# Patient Record
Sex: Male | Born: 1985 | Race: White | Hispanic: No | Marital: Married | Smoking: Never smoker
Health system: Southern US, Community
[De-identification: ages and names within clinical notes are randomized; demographics above are authoritative.]

## PROBLEM LIST (undated history)

## (undated) DIAGNOSIS — K852 Alcohol induced acute pancreatitis without necrosis or infection: Secondary | ICD-10-CM

## (undated) DIAGNOSIS — K219 Gastro-esophageal reflux disease without esophagitis: Secondary | ICD-10-CM

---

## 2006-09-24 ENCOUNTER — Emergency Department (HOSPITAL_COMMUNITY): Admission: EM | Admit: 2006-09-24 | Discharge: 2006-09-24 | Payer: Self-pay | Admitting: Emergency Medicine

## 2006-10-09 ENCOUNTER — Emergency Department (HOSPITAL_COMMUNITY): Admission: EM | Admit: 2006-10-09 | Discharge: 2006-10-09 | Payer: Self-pay | Admitting: Family Medicine

## 2011-01-03 LAB — WOUND CULTURE
Culture: NO GROWTH
Gram Stain: NONE SEEN

## 2012-07-30 ENCOUNTER — Ambulatory Visit (INDEPENDENT_AMBULATORY_CARE_PROVIDER_SITE_OTHER): Payer: Commercial Managed Care - PPO | Admitting: Family Medicine

## 2012-07-30 VITALS — BP 122/88 | HR 62 | Temp 98.6°F | Resp 16 | Ht 70.0 in | Wt 167.2 lb

## 2012-07-30 DIAGNOSIS — L42 Pityriasis rosea: Secondary | ICD-10-CM

## 2012-07-30 NOTE — Patient Instructions (Addendum)
Pityriasis Rosea Pityriasis rosea is a rash which is probably caused by a virus. It generally starts as a scaly, red patch on the trunk (the area of the body that a t-shirt would cover) but does not appear on sun exposed areas. The rash is usually preceded by an initial larger spot called the "herald patch" a week or more before the rest of the rash appears. Generally within one to two days the rash appears rapidly on the trunk, upper arms, and sometimes the upper legs. The rash usually appears as flat, oval patches of scaly pink color. The rash can also be raised and one is able to feel it with a finger. The rash can also be finely crinkled and may slough off leaving a ring of scale around the spot. Sometimes a mild sore throat is present with the rash. It usually affects children and young adults in the spring and autumn. Women are more frequently affected than men. TREATMENT  Pityriasis rosea is a self-limited condition. This means it goes away within 4 to 8 weeks without treatment. The spots may persist for several months, especially in darker-colored skin after the rash has resolved and healed. Benadryl and steroid creams may be used if itching is a problem. SEEK MEDICAL CARE IF:   Your rash does not go away or persists longer than three months.  You develop fever and joint pain.  You develop severe headache and confusion.  You develop breathing difficulty, vomiting and/or extreme weakness. Document Released: 04/13/2001 Document Revised: 05/30/2011 Document Reviewed: 05/02/2008 Exit[employer] Patient Information 2013 [employer].  

## 2012-07-31 ENCOUNTER — Encounter: Payer: Self-pay | Admitting: Family Medicine

## 2012-07-31 NOTE — Progress Notes (Signed)
28 year old gentleman comes in with personal rash over his entire torso. Is not itchy, he's had no fever or stiff neck, and no new exposures to the medicine or environmental possibilities.  Objective patient has a typical (rosea rash scattered over his entire chest and much of his upper back characterized by elliptical raised have to 1 cm plaques.  Assessment: Pityriasis rosea  Plan: Patient given information about pityriasis rosea and reassured.

## 2014-05-06 ENCOUNTER — Ambulatory Visit (INDEPENDENT_AMBULATORY_CARE_PROVIDER_SITE_OTHER): Payer: Managed Care, Other (non HMO) | Admitting: Family Medicine

## 2014-05-06 ENCOUNTER — Encounter: Payer: Self-pay | Admitting: Family Medicine

## 2014-05-06 VITALS — BP 145/90 | HR 73 | Temp 98.4°F | Resp 16 | Ht 71.0 in | Wt 179.0 lb

## 2014-05-06 DIAGNOSIS — R1032 Left lower quadrant pain: Secondary | ICD-10-CM

## 2014-05-06 LAB — COMPREHENSIVE METABOLIC PANEL
ALBUMIN: 4.7 g/dL (ref 3.5–5.2)
ALK PHOS: 45 U/L (ref 39–117)
ALT: 46 U/L (ref 0–53)
AST: 32 U/L (ref 0–37)
BILIRUBIN TOTAL: 0.9 mg/dL (ref 0.2–1.2)
BUN: 10 mg/dL (ref 6–23)
CALCIUM: 9.9 mg/dL (ref 8.4–10.5)
CO2: 25 mEq/L (ref 19–32)
CREATININE: 0.99 mg/dL (ref 0.50–1.35)
Chloride: 102 mEq/L (ref 96–112)
Glucose, Bld: 97 mg/dL (ref 70–99)
Potassium: 4.9 mEq/L (ref 3.5–5.3)
Sodium: 137 mEq/L (ref 135–145)
Total Protein: 7.3 g/dL (ref 6.0–8.3)

## 2014-05-06 LAB — POCT UA - MICROSCOPIC ONLY
CASTS, UR, LPF, POC: NEGATIVE
CRYSTALS, UR, HPF, POC: NEGATIVE
EPITHELIAL CELLS, URINE PER MICROSCOPY: NEGATIVE
Mucus, UA: NEGATIVE
YEAST UA: NEGATIVE

## 2014-05-06 LAB — CBC
HEMATOCRIT: 46.7 % (ref 39.0–52.0)
HEMOGLOBIN: 16.2 g/dL (ref 13.0–17.0)
MCH: 30.5 pg (ref 26.0–34.0)
MCHC: 34.7 g/dL (ref 30.0–36.0)
MCV: 87.9 fL (ref 78.0–100.0)
MPV: 9.1 fL (ref 8.6–12.4)
Platelets: 279 10*3/uL (ref 150–400)
RBC: 5.31 MIL/uL (ref 4.22–5.81)
RDW: 13.3 % (ref 11.5–15.5)
WBC: 8.4 10*3/uL (ref 4.0–10.5)

## 2014-05-06 LAB — POCT URINALYSIS DIPSTICK
BILIRUBIN UA: NEGATIVE
Glucose, UA: NEGATIVE
KETONES UA: NEGATIVE
Leukocytes, UA: NEGATIVE
Nitrite, UA: NEGATIVE
PH UA: 6.5
PROTEIN UA: NEGATIVE
RBC UA: NEGATIVE
Spec Grav, UA: 1.015
Urobilinogen, UA: 0.2

## 2014-05-06 NOTE — Patient Instructions (Signed)
Take ibuprofen 600 mg every 8 hours for next 24 hours Return if you have fever over 101, severe vomiting, diarrhea.

## 2014-05-06 NOTE — Progress Notes (Signed)
Subjective:    Patient ID: Kerry Fernandez, male    DOB: May 31, 1985, 29 y.o.   MRN: [id number]  HPI This is a very pleasant 29 yo male who presents today with intermittent, left lower quadrant abdominal pain for 4 days. It was really bad when he woke up this morning. Excruciating when he stood up first thing this morning. Had increased discomfort with voiding x1 this morning, now pain is improved. He last worked out about a week ago- ran, did sit ups and push ups. Pain has not interfered with his normal activities. He has not taken any medication.   No history of kidney stone, no strong family history of kidney stones.   No past medical history on file. No past surgical history on file. Family History  Problem Relation Age of Onset  . Cancer Paternal Grandmother    History  Substance Use Topics  . Smoking status: Never Smoker   . Smokeless tobacco: Not on file  . Alcohol Use: Yes    Review of Systems No fever or chills, no hematuria, no frequency, some sensation of needing to void without needing to void, no nausea, no vomiting, no scrotal pain, no constipation, no diarrhea. Regular BM- last this am, no blood, no mucus, no loose or hard stool, normal appetite. No chest pain, no SOB     Objective:   Physical Exam  Constitutional: He is oriented to person, place, and time. He appears well-developed and well-nourished.  HENT:  Head: Normocephalic and atraumatic.  Eyes: Conjunctivae are normal.  Neck: Normal range of motion. Neck supple.  Cardiovascular: Normal rate, regular rhythm and normal heart sounds.   Pulmonary/Chest: Effort normal and breath sounds normal.  Abdominal: Soft. Bowel sounds are normal. There is no hepatosplenomegaly. There is tenderness in the left lower quadrant. There is no rigidity, no rebound, no guarding and no CVA tenderness. No hernia.  Musculoskeletal: Normal range of motion.  Neurological: He is alert and oriented to person, place, and time.  Skin:  Skin is warm and dry.  Psychiatric: He has a normal mood and affect. His behavior is normal. Judgment and thought content normal.  Vitals reviewed.  BP 145/90 mmHg  Pulse 73  Temp(Src) 98.4 F (36.9 C) (Oral)  Resp 16  Ht  (1.803 m)  Wt 179 lb (81.194 kg)  BMI 24.98 kg/m2  SpO2 98%    Results for orders placed or performed in visit on 05/06/14  POCT urinalysis dipstick  Result Value Ref Range   Color, UA yellow    Clarity, UA clear    Glucose, UA neg    Bilirubin, UA neg    Ketones, UA neg    Spec Grav, UA 1.015    Blood, UA neg    pH, UA 6.5    Protein, UA neg    Urobilinogen, UA 0.2    Nitrite, UA neg    Leukocytes, UA Negative   POCT UA - Microscopic Only  Result Value Ref Range   WBC, Ur, HPF, POC 0-1    RBC, urine, microscopic 1-2    Bacteria, U Microscopic trace    Mucus, UA neg    Epithelial cells, urine per micros neg    Crystals, Ur, HPF, POC neg    Casts, Ur, LPF, POC neg    Yeast, UA neg    Assessment & Plan:  1. Abdominal pain, left lower quadrant - POCT urinalysis dipstick - POCT UA - Microscopic Only - CBC - Comprehensive metabolic  panel - Urine unremarkable, pain improved from this morning, will treat with NSAIDs for next 24 hours while awaiting labs. I will call him tomorrow with results and to see if improvement in symptoms - Patient instructed to return if he develops fever, vomiting, diarrhea  Emi Belfasteborah B. Gessner, FNP-BC  [facility]  05/06/2014 10:55 AM

## 2014-12-25 ENCOUNTER — Ambulatory Visit (INDEPENDENT_AMBULATORY_CARE_PROVIDER_SITE_OTHER): Payer: Managed Care, Other (non HMO) | Admitting: Podiatry

## 2014-12-25 ENCOUNTER — Encounter: Payer: Self-pay | Admitting: Podiatry

## 2014-12-25 VITALS — BP 131/89 | HR 72 | Resp 12

## 2014-12-25 DIAGNOSIS — L03032 Cellulitis of left toe: Secondary | ICD-10-CM

## 2014-12-25 DIAGNOSIS — L600 Ingrowing nail: Secondary | ICD-10-CM | POA: Diagnosis not present

## 2014-12-25 NOTE — Progress Notes (Signed)
Subjective:     Patient ID: Kerry Fernandez, male   DOB: [birth year], 29 y.o.   MRN: [id number]  HPIThis patient presents to the office for treatment for infected ingrown toenail left foot.  He says the nail was worse days ago and pus and drainage drained from his toe.  He says the infection is better but his nail continues to grow in the inside border left big toe.   Review of Systems     Objective:   Physical Exam GENERAL APPEARANCE: Alert, conversant. Appropriately groomed. No acute distress.  VASCULAR: Pedal pulses palpable at  [facility] and PT bilateral.  Capillary refill time is immediate to all digits,  Normal temperature gradient.  Digital hair growth is present bilateral  NEUROLOGIC: sensation is normal to 5.07 monofilament at 5/5 sites bilateral.  Light touch is intact bilateral, Muscle strength normal.  MUSCULOSKELETAL: acceptable muscle strength, tone and stability bilateral.  Intrinsic muscluature intact bilateral.  Rectus appearance of foot and digits noted bilateral.   DERMATOLOGIC: skin color, texture, and turgor are within normal limits.  No preulcerative lesions or ulcers  are seen, no interdigital maceration noted.  No open lesions present.  . No drainage noted. Nail  Marked incurvation noted medial border left hallux.      Assessment:    Ingrown toenail left foot  Paronychia left hallux.     Plan:     IE  Nail surgery left hallux.Treatment options and alternatives discussed.  Recommended permanent phenol matrixectomy and patient agreed.  Left hallux  was prepped with alcohol and a toe block of 3cc of 2% lidocaine plain was administered in a digital toe block. .  The toe was then prepped with betadine solution .  The offending nail border was then excised and matrix tissue exposed.  Phenol was then applied to the matrix tissue followed by an alcohol wash.  Antibiotic ointment and a dry sterile dressing was applied.  The patient was dispensed instructions for aftercare.  RTC 1  week

## 2015-01-01 ENCOUNTER — Ambulatory Visit: Payer: Managed Care, Other (non HMO) | Admitting: Podiatry

## 2015-01-02 ENCOUNTER — Ambulatory Visit (INDEPENDENT_AMBULATORY_CARE_PROVIDER_SITE_OTHER): Payer: Managed Care, Other (non HMO) | Admitting: Podiatry

## 2015-01-02 ENCOUNTER — Encounter: Payer: Self-pay | Admitting: Podiatry

## 2015-01-02 VITALS — BP 136/93 | HR 70 | Resp 12

## 2015-01-02 DIAGNOSIS — Z09 Encounter for follow-up examination after completed treatment for conditions other than malignant neoplasm: Secondary | ICD-10-CM

## 2015-01-02 NOTE — Progress Notes (Signed)
Subjective:     Patient ID: Kerry FischerMatthew A Fernandez, male   DOB: [birth year], 29 y.o.   MRN: [id number]  HPI This patient presents to the office following nail surgery .  He says his toe is doing well and he has been soaking and bandaging his toe as directed.    Review of Systems     Objective:   Physical Exam GENERAL APPEARANCE: Alert, conversant. Appropriately groomed. No acute distress.  VASCULAR: [facility]  [facility].  Capillary refill time is immediate to all digits,  Normal temperature gradient.  Digital hair growth is present bilateral  NEUROLOGIC: sensation is normal to 5.07 monofilament at 5/5 sites bilateral.  Light touch is intact bilateral, Muscle strength normal.  MUSCULOSKELETAL: acceptable muscle strength, tone and stability bilateral.  Intrinsic muscluature intact bilateral.  Rectus appearance of foot and digits noted bilateral.   DERMATOLOGIC: skin color, texture, and turgor are within normal limits.  No preulcerative lesions or ulcers  are seen, no interdigital maceration noted.  No open lesions present.  Digital nails are asymptomatic. No drainage noted. NAILS  No signs of redness or swelling minimal at the surgical site.     Assessment:     S/p nail surgery     Plan:     ROV>  Debride necrotic tissue.  Continue home instructions.

## 2015-08-08 ENCOUNTER — Encounter (HOSPITAL_BASED_OUTPATIENT_CLINIC_OR_DEPARTMENT_OTHER): Payer: Self-pay | Admitting: Emergency Medicine

## 2015-08-08 ENCOUNTER — Emergency Department (HOSPITAL_BASED_OUTPATIENT_CLINIC_OR_DEPARTMENT_OTHER): Payer: Managed Care, Other (non HMO)

## 2015-08-08 ENCOUNTER — Emergency Department (HOSPITAL_BASED_OUTPATIENT_CLINIC_OR_DEPARTMENT_OTHER)
Admission: EM | Admit: 2015-08-08 | Discharge: 2015-08-09 | Disposition: A | Discharge status: Home / Self Care | Payer: Managed Care, Other (non HMO) | Source: Home / Self Care | Attending: Emergency Medicine | Admitting: Emergency Medicine

## 2015-08-08 DIAGNOSIS — K852 Alcohol induced acute pancreatitis without necrosis or infection: Secondary | ICD-10-CM

## 2015-08-08 DIAGNOSIS — R1013 Epigastric pain: Secondary | ICD-10-CM | POA: Diagnosis not present

## 2015-08-08 DIAGNOSIS — R112 Nausea with vomiting, unspecified: Secondary | ICD-10-CM

## 2015-08-08 DIAGNOSIS — R1033 Periumbilical pain: Secondary | ICD-10-CM

## 2015-08-08 HISTORY — DX: Alcohol induced acute pancreatitis without necrosis or infection: K85.20

## 2015-08-08 LAB — COMPREHENSIVE METABOLIC PANEL
ALT: 48 U/L (ref 17–63)
AST: 43 U/L — AB (ref 15–41)
Albumin: 4.7 g/dL (ref 3.5–5.0)
Alkaline Phosphatase: 45 U/L (ref 38–126)
Anion gap: 13 (ref 5–15)
BUN: 15 mg/dL (ref 6–20)
CHLORIDE: 102 mmol/L (ref 101–111)
CO2: 22 mmol/L (ref 22–32)
CREATININE: 1.13 mg/dL (ref 0.61–1.24)
Calcium: 9.1 mg/dL (ref 8.9–10.3)
GFR calc Af Amer: >60 mL/min (ref >60)
GFR calc non Af Amer: >60 mL/min (ref >60)
Glucose, Bld: 136 mg/dL — ABNORMAL HIGH (ref 65–99)
POTASSIUM: 3.3 mmol/L — AB (ref 3.5–5.1)
SODIUM: 137 mmol/L (ref 135–145)
Total Bilirubin: 0.6 mg/dL (ref 0.3–1.2)
Total Protein: 7.6 g/dL (ref 6.5–8.1)

## 2015-08-08 LAB — URINALYSIS, ROUTINE W REFLEX MICROSCOPIC
Bilirubin Urine: NEGATIVE
Glucose, UA: NEGATIVE mg/dL
Hgb urine dipstick: NEGATIVE
Ketones, ur: NEGATIVE mg/dL
LEUKOCYTES UA: NEGATIVE
NITRITE: NEGATIVE
PROTEIN: NEGATIVE mg/dL
SPECIFIC GRAVITY, URINE: 1.012 (ref 1.005–1.030)
pH: 6 (ref 5.0–8.0)

## 2015-08-08 LAB — CBC
HEMATOCRIT: 45 % (ref 39.0–52.0)
Hemoglobin: 16.1 g/dL (ref 13.0–17.0)
MCH: 31.5 pg (ref 26.0–34.0)
MCHC: 35.8 g/dL (ref 30.0–36.0)
MCV: 88.1 fL (ref 78.0–100.0)
PLATELETS: 299 10*3/uL (ref 150–400)
RBC: 5.11 MIL/uL (ref 4.22–5.81)
RDW: 12.7 % (ref 11.5–15.5)
WBC: 14.1 10*3/uL — ABNORMAL HIGH (ref 4.0–10.5)

## 2015-08-08 LAB — LIPASE, BLOOD: LIPASE: 183 U/L — AB (ref 11–51)

## 2015-08-08 MED ORDER — MORPHINE SULFATE (PF) 4 MG/ML IV SOLN
4.0000 mg | INTRAVENOUS | Status: DC | PRN
  Administered 2015-08-08: 4 mg via INTRAVENOUS
  Filled 2015-08-08: qty 1

## 2015-08-08 MED ORDER — MORPHINE SULFATE (PF) 4 MG/ML IV SOLN: 4.0000 mg | Freq: Once | INTRAVENOUS | Status: DC

## 2015-08-08 MED ORDER — HYDROMORPHONE HCL 1 MG/ML IJ SOLN
0.5000 mg | INTRAMUSCULAR | Status: DC | PRN
  Administered 2015-08-08 (×2): 0.5 mg via INTRAVENOUS
  Filled 2015-08-08 (×2): qty 1

## 2015-08-08 MED ORDER — ONDANSETRON HCL 4 MG/2ML IJ SOLN
4.0000 mg | Freq: Once | INTRAMUSCULAR | Status: AC | PRN
  Administered 2015-08-08: 4 mg via INTRAVENOUS
  Filled 2015-08-08: qty 2

## 2015-08-08 MED ORDER — HYDROMORPHONE HCL 1 MG/ML IJ SOLN
1.0000 mg | Freq: Once | INTRAMUSCULAR | Status: AC
  Administered 2015-08-08: 1 mg via INTRAVENOUS
  Filled 2015-08-08: qty 1

## 2015-08-08 MED ORDER — IOPAMIDOL (ISOVUE-300) INJECTION 61%
100.0000 mL | Freq: Once | INTRAVENOUS | Status: AC | PRN
  Administered 2015-08-08: 100 mL via INTRAVENOUS

## 2015-08-08 NOTE — ED Notes (Signed)
Patient transported to CT 

## 2015-08-08 NOTE — ED Notes (Signed)
PA at bedside.

## 2015-08-08 NOTE — ED Provider Notes (Signed)
CSN: [id number]     Arrival date & time 08/08/15  2113 History   First MD Initiated Contact with Patient 08/08/15 2139     Chief Complaint  Patient presents with  . Abdominal Pain     (Consider location/radiation/quality/duration/timing/severity/associated sxs/prior Treatment) HPI Comments: Patient is a 30 year old male with history of acid reflux who presents with acute onset abdominal pain. Patient states this pain is in his middle abdomen. He describes his pain as stabbing, crampy, shooting. The pain does not radiate. Patient rates his pain as a 9/10. Patient has had associated sweats and nausea, vomiting. Patient did not try any medications at [facility]. Patient has no history of abdominal disorders or surgeries. Patient has appendix and gallbladder. Patient states he has 3-4 mixed drinks per day. He had eaten and drank alcohol about 30 minutes prior to symptom onset. Patient denies chest pain, shortness of breath, back pain, dysuria. Patient states he normally takes Tums with good alleviation of his pain from acid reflux. He states today's pain feels nothing like his acid reflux.  Patient is a 30 y.o. male presenting with abdominal pain. The history is provided by the patient.  Abdominal Pain Associated symptoms: nausea and vomiting   Associated symptoms: no chest pain, no chills, no dysuria, no fever, no shortness of breath and no sore throat     History reviewed. No pertinent past medical history. History reviewed. No pertinent past surgical history. Family History  Problem Relation Age of Onset  . Cancer Paternal Grandmother    Social History  Substance Use Topics  . Smoking status: Never Smoker   . Smokeless tobacco: Never Used  . Alcohol Use: 0.0 oz/week    0 Standard drinks or equivalent per week    Review of Systems  Constitutional: Negative for fever and chills.  HENT: Negative for facial swelling and sore throat.   Respiratory: Negative for shortness of breath.     Cardiovascular: Negative for chest pain.  Gastrointestinal: Positive for nausea, vomiting and abdominal pain.  Genitourinary: Negative for dysuria.  Musculoskeletal: Negative for back pain.  Skin: Negative for rash and wound.  Neurological: Negative for headaches.  Psychiatric/Behavioral: The patient is not nervous/anxious.       Allergies  Penicillins  Home Medications   Prior to Admission medications   Medication Sig Start Date End Date Taking? Authorizing Provider  ondansetron (ZOFRAN) 4 MG tablet Take 1 tablet (4 mg total) by mouth every 6 (six) hours. 08/08/15   Emi Holes, PA-C  oxyCODONE-acetaminophen (PERCOCET/ROXICET) 5-325 MG tablet Take 1-2 tablets by mouth every 4 (four) hours as needed for severe pain. 08/08/15   Wetona Viramontes M Fadel Clason, PA-C   BP 124/89 mmHg  Pulse 80  Temp(Src) 97.6 F (36.4 C) (Oral)  Resp 20  Ht 5\' 10"  (1.778 m)  Wt 81.647 kg  BMI 25.83 kg/m2  SpO2 100% Physical Exam  Constitutional: He appears well-developed and well-nourished. No distress.  HENT:  Head: Normocephalic and atraumatic.  Mouth/Throat: Oropharynx is clear and moist. No oropharyngeal exudate.  Eyes: Conjunctivae are normal. Pupils are equal, round, and reactive to light. Right eye exhibits no discharge. Left eye exhibits no discharge. No scleral icterus.  Neck: Normal range of motion. Neck supple. No thyromegaly present.  Cardiovascular: Normal rate, regular rhythm, normal heart sounds and intact distal pulses.  Exam reveals no gallop and no friction rub.   No murmur heard. Pulmonary/Chest: Effort normal and breath sounds normal. No stridor. No respiratory distress. He has no wheezes. He  has no rales.  Abdominal: Soft. Bowel sounds are normal. He exhibits no distension. There is tenderness in the periumbilical area. There is positive Murphy's sign. There is no rebound, no guarding, no CVA tenderness and no tenderness at McBurney's point.    Musculoskeletal: He exhibits no edema.   Lymphadenopathy:    He has no cervical adenopathy.  Neurological: He is alert. Coordination normal.  Skin: Skin is warm. No rash noted. He is diaphoretic. No pallor.  Psychiatric: He has a normal mood and affect.  Nursing note and vitals reviewed.   ED Course  Procedures (including critical care time) Labs Review Labs Reviewed  LIPASE, BLOOD - Abnormal; Notable for the following:    Lipase 183 (*)    All other components within normal limits  COMPREHENSIVE METABOLIC PANEL - Abnormal; Notable for the following:    Potassium 3.3 (*)    Glucose, Bld 136 (*)    AST 43 (*)    All other components within normal limits  CBC - Abnormal; Notable for the following:    WBC 14.1 (*)    All other components within normal limits  URINALYSIS, ROUTINE W REFLEX MICROSCOPIC (NOT AT [facility])  URINALYSIS, ROUTINE W REFLEX MICROSCOPIC (NOT AT [facility])    Imaging Review Ct Abdomen Pelvis W Contrast  08/08/2015  CLINICAL DATA:  30 year old presenting with acute onset of severe right lower quadrant abdominal pain associated with nausea. Leukocytosis with white blood count of [address]. EXAM: CT ABDOMEN AND PELVIS WITH CONTRAST TECHNIQUE: Multidetector CT imaging of the abdomen and pelvis was performed using the standard protocol following bolus administration of intravenous contrast. CONTRAST:  ISOVUE-300 IOPAMIDOL 61% IV. Oral contrast was also administered. COMPARISON:  None. FINDINGS: Lower chest:  Heart size normal.  Visualized lung bases clear. Hepatobiliary: Liver normal in size and appearance. Gallbladder mildly distended without calcified gallstones. No pericholecystic edema/inflammation. No biliary ductal dilation. Pancreas: Normal in appearance without evidence of mass, ductal dilation, or inflammation. Spleen: Normal in size and appearance. Adrenals/Urinary Tract: Normal appearing adrenal glands. Kidneys normal in size and appearance without focal parenchymal abnormality. No evidence of urinary tract  calculi or obstruction. Normal-appearing urinary bladder. Stomach/Bowel: Stomach normal in appearance for the degree of distention. Normal-appearing small bowel. Expected stool burden throughout normal appearing, relatively decompressed colon. Normal appendix in the right upper pelvis. Vascular/Lymphatic: No visible aortoiliofemoral atherosclerosis. Widely patent visceral arteries. Normal-appearing portal venous and systemic venous systems. No pathologic lymphadenopathy. Reproductive: Prostate gland and seminal vesicles normal in size and appearance for age. Other: None. Musculoskeletal: Regional skeleton intact without acute or significant osseous abnormality. IMPRESSION: 1. Mildly distended gallbladder without evidence of cholelithiasis or acute cholecystitis. 2. Otherwise normal examination. Electronically Signed   By: Hulan Saas M.D.   On: 08/08/2015 23:35   I have personally reviewed and evaluated these images and lab results as part of my medical decision-making.   EKG Interpretation None      MDM   Patient's nausea controlled with Zofran. Patient's pain moderately controlled with Dilaudid. CBC shows WBC 14.1. CMP shows potassium 3.3, AST 43, glucose 136. Lipase 183. UA negative. CT abdomen pelvis with contrast shows mildly distended gallbladder without evidence of cholelithiasis or acute cholecystitis, otherwise normal examination. Patient discharged with pain medication and Zofran with follow-up to gastroenterology. Patient tolerating fluids prior to discharge. Patient advised to avoid alcohol intake due to possible pancreatitis. Strict return precautions given. Patient understands and is in agreement with plan. Patient vitals stable throughout ED course and is discharged  in satisfactory condition. Patient discussed with Dr. Particia NearingHaviland who is in agreement with plan.  Final diagnoses:  Periumbilical abdominal pain       Emi Holeslexandra M Rodell Marrs, PA-C 08/09/15 0028

## 2015-08-08 NOTE — ED Notes (Signed)
Patient reports he had had 2 hours of right upper quadrant pain. The patient reports N/V

## 2015-08-09 ENCOUNTER — Telehealth (HOSPITAL_COMMUNITY): Payer: Self-pay

## 2015-08-09 ENCOUNTER — Inpatient Hospital Stay (HOSPITAL_BASED_OUTPATIENT_CLINIC_OR_DEPARTMENT_OTHER)
Admission: EM | Admit: 2015-08-09 | Discharge: 2015-08-15 | DRG: 439 | Disposition: A | Discharge status: Home / Self Care | Payer: Managed Care, Other (non HMO) | Attending: Internal Medicine | Admitting: Internal Medicine

## 2015-08-09 ENCOUNTER — Encounter (HOSPITAL_BASED_OUTPATIENT_CLINIC_OR_DEPARTMENT_OTHER): Payer: Self-pay | Admitting: Emergency Medicine

## 2015-08-09 ENCOUNTER — Encounter (HOSPITAL_COMMUNITY): Payer: Self-pay | Admitting: Family Medicine

## 2015-08-09 ENCOUNTER — Emergency Department (HOSPITAL_COMMUNITY)
Admission: EM | Admit: 2015-08-09 | Discharge: 2015-08-09 | Disposition: A | Discharge status: Home / Self Care | Payer: Managed Care, Other (non HMO) | Source: Home / Self Care | Attending: Emergency Medicine | Admitting: Emergency Medicine

## 2015-08-09 DIAGNOSIS — K859 Acute pancreatitis without necrosis or infection, unspecified: Secondary | ICD-10-CM

## 2015-08-09 DIAGNOSIS — K852 Alcohol induced acute pancreatitis without necrosis or infection: Principal | ICD-10-CM

## 2015-08-09 DIAGNOSIS — R1110 Vomiting, unspecified: Secondary | ICD-10-CM

## 2015-08-09 DIAGNOSIS — Z880 Allergy status to penicillin: Secondary | ICD-10-CM | POA: Insufficient documentation

## 2015-08-09 DIAGNOSIS — K861 Other chronic pancreatitis: Secondary | ICD-10-CM | POA: Diagnosis present

## 2015-08-09 DIAGNOSIS — Z79899 Other long term (current) drug therapy: Secondary | ICD-10-CM | POA: Insufficient documentation

## 2015-08-09 DIAGNOSIS — R1013 Epigastric pain: Secondary | ICD-10-CM

## 2015-08-09 DIAGNOSIS — R509 Fever, unspecified: Secondary | ICD-10-CM

## 2015-08-09 DIAGNOSIS — R109 Unspecified abdominal pain: Secondary | ICD-10-CM

## 2015-08-09 DIAGNOSIS — F1020 Alcohol dependence, uncomplicated: Secondary | ICD-10-CM | POA: Diagnosis present

## 2015-08-09 DIAGNOSIS — K567 Ileus, unspecified: Secondary | ICD-10-CM | POA: Diagnosis not present

## 2015-08-09 DIAGNOSIS — K219 Gastro-esophageal reflux disease without esophagitis: Secondary | ICD-10-CM | POA: Diagnosis present

## 2015-08-09 DIAGNOSIS — K5900 Constipation, unspecified: Secondary | ICD-10-CM | POA: Diagnosis present

## 2015-08-09 HISTORY — DX: Alcohol induced acute pancreatitis without necrosis or infection: K85.20

## 2015-08-09 HISTORY — DX: Gastro-esophageal reflux disease without esophagitis: K21.9

## 2015-08-09 LAB — CBC WITH DIFFERENTIAL/PLATELET
BASOS ABS: 0 10*3/uL (ref 0.0–0.1)
Basophils Relative: 0 %
EOS PCT: 0 %
Eosinophils Absolute: 0 10*3/uL (ref 0.0–0.7)
HEMATOCRIT: 43.3 % (ref 39.0–52.0)
Hemoglobin: 15.4 g/dL (ref 13.0–17.0)
LYMPHS ABS: 0.7 10*3/uL (ref 0.7–4.0)
Lymphocytes Relative: 3 %
MCH: 31.5 pg (ref 26.0–34.0)
MCHC: 35.6 g/dL (ref 30.0–36.0)
MCV: 88.5 fL (ref 78.0–100.0)
Monocytes Absolute: 1.3 10*3/uL — ABNORMAL HIGH (ref 0.1–1.0)
Monocytes Relative: 6 %
NEUTROS ABS: 18.7 10*3/uL — AB (ref 1.7–7.7)
Neutrophils Relative %: 91 %
Platelets: 260 10*3/uL (ref 150–400)
RBC: 4.89 MIL/uL (ref 4.22–5.81)
RDW: 12.9 % (ref 11.5–15.5)
WBC: 20.7 10*3/uL — AB (ref 4.0–10.5)

## 2015-08-09 LAB — COMPREHENSIVE METABOLIC PANEL
ALBUMIN: 4.2 g/dL (ref 3.5–5.0)
ALK PHOS: 48 U/L (ref 38–126)
ALK PHOS: 41 U/L (ref 38–126)
ALT: 42 U/L (ref 17–63)
ALT: 35 U/L (ref 17–63)
ANION GAP: 10 (ref 5–15)
AST: 29 U/L (ref 15–41)
AST: 28 U/L (ref 15–41)
Albumin: 4.2 g/dL (ref 3.5–5.0)
Anion gap: 9 (ref 5–15)
BILIRUBIN TOTAL: 1.2 mg/dL (ref 0.3–1.2)
BILIRUBIN TOTAL: 1.5 mg/dL — AB (ref 0.3–1.2)
BUN: 8 mg/dL (ref 6–20)
BUN: 12 mg/dL (ref 6–20)
CALCIUM: 9.7 mg/dL (ref 8.9–10.3)
CALCIUM: 8.8 mg/dL — AB (ref 8.9–10.3)
CO2: 26 mmol/L (ref 22–32)
CO2: 22 mmol/L (ref 22–32)
CREATININE: 0.98 mg/dL (ref 0.61–1.24)
CREATININE: 0.83 mg/dL (ref 0.61–1.24)
Chloride: 101 mmol/L (ref 101–111)
Chloride: 103 mmol/L (ref 101–111)
GFR calc Af Amer: >60 mL/min (ref >60)
GFR calc non Af Amer: >60 mL/min (ref >60)
GLUCOSE: 115 mg/dL — AB (ref 65–99)
Glucose, Bld: 124 mg/dL — ABNORMAL HIGH (ref 65–99)
Potassium: 3.7 mmol/L (ref 3.5–5.1)
Potassium: 4.2 mmol/L (ref 3.5–5.1)
Sodium: 137 mmol/L (ref 135–145)
Sodium: 134 mmol/L — ABNORMAL LOW (ref 135–145)
TOTAL PROTEIN: 7 g/dL (ref 6.5–8.1)
TOTAL PROTEIN: 7 g/dL (ref 6.5–8.1)

## 2015-08-09 LAB — LIPASE, BLOOD
LIPASE: 105 U/L — AB (ref 11–51)
Lipase: 105 U/L — ABNORMAL HIGH (ref 11–51)

## 2015-08-09 LAB — CBC
HCT: 46.6 % (ref 39.0–52.0)
HEMOGLOBIN: 16 g/dL (ref 13.0–17.0)
MCH: 30.2 pg (ref 26.0–34.0)
MCHC: 34.3 g/dL (ref 30.0–36.0)
MCV: 87.9 fL (ref 78.0–100.0)
PLATELETS: 273 10*3/uL (ref 150–400)
RBC: 5.3 MIL/uL (ref 4.22–5.81)
RDW: 12.5 % (ref 11.5–15.5)
WBC: 15.5 10*3/uL — ABNORMAL HIGH (ref 4.0–10.5)

## 2015-08-09 MED ORDER — GI COCKTAIL ~~LOC~~
30.0000 mL | Freq: Once | ORAL | Status: AC
  Administered 2015-08-09: 30 mL via ORAL
  Filled 2015-08-09: qty 30

## 2015-08-09 MED ORDER — SODIUM CHLORIDE 0.9 % IV BOLUS (SEPSIS)
1000.0000 mL | Freq: Once | INTRAVENOUS | Status: AC
  Administered 2015-08-09: 1000 mL via INTRAVENOUS

## 2015-08-09 MED ORDER — FAMOTIDINE 20 MG PO TABS: 20.0000 mg | ORAL_TABLET | Freq: Two times a day (BID) | ORAL | Status: AC

## 2015-08-09 MED ORDER — FENTANYL CITRATE (PF) 100 MCG/2ML IJ SOLN
50.0000 ug | Freq: Once | INTRAMUSCULAR | Status: AC
  Administered 2015-08-09: 50 ug via INTRAVENOUS
  Filled 2015-08-09: qty 2

## 2015-08-09 MED ORDER — PANTOPRAZOLE SODIUM 40 MG PO TBEC: 40.0000 mg | DELAYED_RELEASE_TABLET | Freq: Every day | ORAL | Status: AC

## 2015-08-09 MED ORDER — MORPHINE SULFATE (PF) 4 MG/ML IV SOLN
8.0000 mg | Freq: Once | INTRAVENOUS | Status: AC
  Administered 2015-08-09: 8 mg via INTRAVENOUS
  Filled 2015-08-09: qty 2

## 2015-08-09 MED ORDER — OXYCODONE-ACETAMINOPHEN 5-325 MG PO TABS: 1.0000 | ORAL_TABLET | ORAL | Status: DC | PRN

## 2015-08-09 MED ORDER — FENTANYL CITRATE (PF) 100 MCG/2ML IJ SOLN
50.0000 ug | Freq: Once | INTRAMUSCULAR | Status: AC
  Administered 2015-08-09: 50 ug via INTRAVENOUS

## 2015-08-09 MED ORDER — SODIUM CHLORIDE 0.9 % IV SOLN
Freq: Once | INTRAVENOUS | Status: AC
  Administered 2015-08-09: 50 mL/h via INTRAVENOUS

## 2015-08-09 MED ORDER — ONDANSETRON HCL 4 MG PO TABS: 4.0000 mg | ORAL_TABLET | Freq: Four times a day (QID) | ORAL | Status: AC

## 2015-08-09 MED ORDER — ONDANSETRON HCL 4 MG/2ML IJ SOLN
4.0000 mg | Freq: Once | INTRAMUSCULAR | Status: AC
  Administered 2015-08-09: 4 mg via INTRAVENOUS
  Filled 2015-08-09: qty 2

## 2015-08-09 MED ORDER — FENTANYL CITRATE (PF) 100 MCG/2ML IJ SOLN
50.0000 ug | INTRAMUSCULAR | Status: DC | PRN
  Administered 2015-08-10 (×3): 50 ug via INTRAVENOUS
  Filled 2015-08-09 (×3): qty 2

## 2015-08-09 NOTE — ED Notes (Signed)
Pt here for abd pain. [facility] and not [facility]. sts has been taking the percocet and the pain slacks off a bit but then comes back severe. sts vomiting.

## 2015-08-09 NOTE — ED Provider Notes (Signed)
CSN: [id number]     Arrival date & time 08/09/15  2041 History  By signing my name below, I, Ronney LionSuzanne Le, attest that this documentation has been prepared under the direction and in the presence of Paula LibraJohn Valerio Pinard, MD. Electronically Signed: Ronney LionSuzanne Le, ED Scribe. 08/09/2015. 12:04 PM.    Chief Complaint  Patient presents with  . Abdominal Pain   The history is provided by the patient. No language interpreter was used.   HPI Comments: Kerry FischerMatthew A Fernandez is a 30 y.o. male who presents to the Emergency Department complaining of persistent epigastric abdominal pain that began last night at 8 PM, shortly after he last had an alcoholic drink. He complains of associated diaphoresis ("hot flashes"), as well as constipation that he attributes to taking narcotic pain medications. This is his third visit in the past 24 hours for the same complaint, per medical records. Patient was seen twice in the ED yesterday, diagnosed with pancreatitis, and discharged home. He was given Percocet, Zofran, Pepcid, and Protonix. He states he felt well upon discharge, but soon after leaving the hospital, he developed nausea and vomiting. He states he returned to the ED because he is unable to tolerate pain, despite taking Percocet, and cannot tolerate any food or water without vomiting. He rates his pain initially on arrival as 10/10 that decreased to 7/10 after receiving Fentanyl upon arrival. Patient states he feels dehydrated, as he has been vomiting every time he takes a sip of water. Patient states he has never had pancreatitis in the past. He denies fever. He states his abdomen was distended earlier.  Past Medical History  Diagnosis Date  . Pancreatitis, alcoholic, acute 08/08/2015   History reviewed. No pertinent past surgical history. Family History  Problem Relation Age of Onset  . Cancer Paternal Grandmother    Social History  Substance Use Topics  . Smoking status: Never Smoker   . Smokeless tobacco: Never Used  .  Alcohol Use: 0.0 oz/week    0 Standard drinks or equivalent per week    Review of Systems  A complete 10 system review of systems was obtained and all systems are negative except as noted in the HPI and PMH.    Allergies  Penicillins  Home Medications   Prior to Admission medications   Medication Sig Start Date End Date Taking? Authorizing Provider  Ascorbic Acid (VITAMIN C) 1000 MG tablet Take 2,000 mg by mouth 4 (four) times daily as needed (immune system boost).    Historical Provider, MD  famotidine (PEPCID) 20 MG tablet Take 1 tablet (20 mg total) by mouth 2 (two) times daily. 08/09/15   Pricilla LovelessScott Goldston, MD  ibuprofen (ADVIL,MOTRIN) 200 MG tablet Take 600 mg by mouth every 6 (six) hours as needed (pain).    Historical Provider, MD  ondansetron (ZOFRAN) 4 MG tablet Take 1 tablet (4 mg total) by mouth every 6 (six) hours. Patient taking differently: Take 4 mg by mouth every 6 (six) hours as needed for nausea or vomiting.  08/08/15   Emi HolesAlexandra M Law, PA-C  oxyCODONE-acetaminophen (PERCOCET/ROXICET) 5-325 MG tablet Take 1-2 tablets by mouth every 4 (four) hours as needed for severe pain. 08/08/15   Emi HolesAlexandra M Law, PA-C  pantoprazole (PROTONIX) 40 MG tablet Take 1 tablet (40 mg total) by mouth daily. 08/09/15   Pricilla LovelessScott Goldston, MD  Tetrahydrozoline HCl (VISINE OP) Place 1 drop into both eyes daily as needed (dry eyes).    Historical Provider, MD   BP 125/82 mmHg  Pulse  92  Temp(Src) 98.2 F (36.8 C) (Oral)  Resp 18  Ht  (1.803 m)  Wt 180 lb (81.647 kg)  BMI 25.12 kg/m2  SpO2 97%   Physical Exam  Nursing note and vitals reviewed. General: Well-developed, well-nourished male in no acute distress; appearance consistent with age of record HENT: normocephalic; atraumatic Eyes: pupils equal, round and reactive to light; extraocular muscles intact Neck: supple Heart: regular rate and rhythm Lungs: clear to auscultation bilaterally Abdomen: soft; mildly distended; epigastric  tenderness; no masses or hepatosplenomegaly; bowel sounds present Extremities: No deformity; full range of motion; pulses normal Neurologic: Awake, alert and oriented; motor function intact in all extremities and symmetric; no facial droop Skin: Warm and dry Psychiatric: Normal mood and affect   ED Course  Procedures (including critical care time)   MDM   Nursing notes and vitals signs, including pulse oximetry, reviewed.  Summary of this visit's results, reviewed by myself:  Labs:  Results for orders placed or performed during the hospital encounter of 08/09/15 (from the past 24 hour(s))  CBC with Differential/Platelet     Status: Abnormal   Collection Time: 08/09/15 11:20 PM  Result Value Ref Range   WBC 20.7 (H) 4.0 - 10.5 K/uL   RBC 4.89 4.22 - 5.81 MIL/uL   Hemoglobin 15.4 13.0 - 17.0 g/dL   HCT 16.1 09.6 - 04.5 %   MCV 88.5 78.0 - 100.0 fL   MCH 31.5 26.0 - 34.0 pg   MCHC 35.6 30.0 - 36.0 g/dL   RDW 40.9 81.1 - 91.4 %   Platelets 260 150 - 400 K/uL   Neutrophils Relative % 91 %   Neutro Abs 18.7 (H) 1.7 - 7.7 K/uL   Lymphocytes Relative 3 %   Lymphs Abs 0.7 0.7 - 4.0 K/uL   Monocytes Relative 6 %   Monocytes Absolute 1.3 (H) 0.1 - 1.0 K/uL   Eosinophils Relative 0 %   Eosinophils Absolute 0.0 0.0 - 0.7 K/uL   Basophils Relative 0 %   Basophils Absolute 0.0 0.0 - 0.1 K/uL  Comprehensive metabolic panel     Status: Abnormal   Collection Time: 08/09/15 11:20 PM  Result Value Ref Range   Sodium 134 (L) 135 - 145 mmol/L   Potassium 4.2 3.5 - 5.1 mmol/L   Chloride 103 101 - 111 mmol/L   CO2 22 22 - 32 mmol/L   Glucose, Bld 124 (H) 65 - 99 mg/dL   BUN 12 6 - 20 mg/dL   Creatinine, Ser 7.82 0.61 - 1.24 mg/dL   Calcium 8.8 (L) 8.9 - 10.3 mg/dL   Total Protein 7.0 6.5 - 8.1 g/dL   Albumin 4.2 3.5 - 5.0 g/dL   AST 28 15 - 41 U/L   ALT 35 17 - 63 U/L   Alkaline Phosphatase 41 38 - 126 U/L   Total Bilirubin 1.5 (H) 0.3 - 1.2 mg/dL   GFR calc non Af Amer >60 >60  mL/min   GFR calc Af Amer >60 >60 mL/min   Anion gap 9 5 - 15  Lipase, blood     Status: Abnormal   Collection Time: 08/09/15 11:20 PM  Result Value Ref Range   Lipase 105 (H) 11 - 51 U/L    Imaging Studies: Ct Abdomen Pelvis W Contrast  08/08/2015  CLINICAL DATA:  30 year old presenting with acute onset of severe right lower quadrant abdominal pain associated with nausea. [place]ukocytosis with [place] of [address]. EXAM: CT ABDOMEN AND PELVIS WITH CONTRAST  TECHNIQUE: Multidetector CT imaging of the abdomen and pelvis was performed using the standard protocol following bolus administration of intravenous contrast. CONTRAST:  ISOVUE-300 IOPAMIDOL 61% IV. Oral contrast was also administered. COMPARISON:  None. FINDINGS: Lower chest:  Heart size normal.  Visualized lung bases clear. Hepatobiliary: Liver normal in size and appearance. Gallbladder mildly distended without calcified gallstones. No pericholecystic edema/inflammation. No biliary ductal dilation. Pancreas: Normal in appearance without evidence of mass, ductal dilation, or inflammation. Spleen: Normal in size and appearance. Adrenals/Urinary Tract: Normal appearing adrenal glands. Kidneys normal in size and appearance without focal parenchymal abnormality. No evidence of urinary tract calculi or obstruction. Normal-appearing urinary bladder. Stomach/Bowel: Stomach normal in appearance for the degree of distention. Normal-appearing small bowel. Expected stool burden throughout normal appearing, relatively decompressed colon. Normal appendix in the right upper pelvis. Vascular/Lymphatic: No visible aortoiliofemoral atherosclerosis. Widely patent visceral arteries. Normal-appearing portal venous and systemic venous systems. No pathologic lymphadenopathy. Reproductive: Prostate gland and seminal vesicles normal in size and appearance for age. Other: None. Musculoskeletal: Regional skeleton intact without acute or significant osseous  abnormality. IMPRESSION: 1. Mildly distended gallbladder without evidence of cholelithiasis or acute cholecystitis. 2. Otherwise normal examination. Electronically Signed   By: Hulan Saas M.D.   On: 08/08/2015 23:35     Final diagnoses:  Alcohol-induced acute pancreatitis without infection or necrosis  Intractable vomiting with nausea, vomiting of unspecified type   I personally performed the services described in this documentation, which was scribed in my presence. The recorded information has been reviewed and is accurate.     Paula Libra, MD 08/10/15 [phone]

## 2015-08-09 NOTE — Telephone Encounter (Addendum)
Late entry: Wife called husband gave permission for current Clinical research associatewriter to speak with wife regarding visit.  Wife stated they were told there was a medicine that help pt w/alcohol withdrawal.  Chart reviewed no indication medicine Rx was to be given attemtped to call clinician who saw pt but they had already gone for the day.  Spoke w/Dr Deretha EmoryZackowski regarding pt and was told that sometimes ativan for short course (5 days) is given but since he didn't see pt pt would need to return for face to face with another clinician.  Pts wife informed stated pt was vomiting again informed to return as needed.

## 2015-08-09 NOTE — ED Notes (Signed)
Pt is in stable condition upon d/c and ambulates from ED. 

## 2015-08-09 NOTE — ED Notes (Signed)
No changes, continues to moan, IVF and zofran given, ice chips given per request for comfort.

## 2015-08-09 NOTE — ED Notes (Signed)
Went in to d/c the patient and he is asking once again for more pain meds. Patient is well aware that he is to not receive any other pain medications. Explained to the patient what the PA stated and that he was getting an RX for Percocet.

## 2015-08-09 NOTE — ED Notes (Signed)
Pt calmer, "feel better after pain and nausea med", no longer moaning, relaxed watching TV, [facility] Read DriversMolpus into room, [facility]

## 2015-08-09 NOTE — ED Notes (Signed)
C/o abd pain and nv. Also sweating. Denies other sx. "feel dehydrated, [facility]", here yesterday for the same, [facility]), VSS, moaning. Last BM yesterday, last emesis PTA, unable to keep meds or pedialyte down. Pt states, "Increased ETOH use yesterday and recently triggered episode last night, which continues and is progressively worse".

## 2015-08-09 NOTE — Discharge Instructions (Signed)
Medications: Zofran, Percocet  Treatment: Take Zofran every 6 hours as needed for nausea or vomiting. Take 1-2 Percocet every 4-6 hours as needed for severe pain. Make sure to stay hydrated. Do not drink alcohol.  Follow-up: Please follow-up with [facility] for further evaluation and treatment of your symptoms. Please follow-up with your primary care provider in 2 days for follow-up of today's visit. Please return to emergency department if you develop any new or worsening symptoms.   Abdominal Pain, Adult Many things can cause abdominal pain. Usually, abdominal pain is not caused by a disease and will improve without treatment. It can often be observed and treated at home. Your health care provider will do a physical exam and possibly order blood tests and X-rays to help determine the seriousness of your pain. However, in many cases, more time must pass before a clear cause of the pain can be found. Before that point, your health care provider may not know if you need more testing or further treatment. HOME CARE INSTRUCTIONS Monitor your abdominal pain for any changes. The following actions may help to alleviate any discomfort you are experiencing:  Only take over-the-counter or prescription medicines as directed by your health care provider.  Do not take laxatives unless directed to do so by your health care provider.  Try a clear liquid diet (broth, tea, or water) as directed by your health care provider. Slowly move to a bland diet as tolerated. SEEK MEDICAL CARE IF:  You have unexplained abdominal pain.  You have abdominal pain associated with nausea or diarrhea.  You have pain when you urinate or have a bowel movement.  You experience abdominal pain that wakes you in the night.  You have abdominal pain that is worsened or improved by eating food.  You have abdominal pain that is worsened with eating fatty foods.  You have a fever. SEEK IMMEDIATE MEDICAL CARE  IF:  Your pain does not go away within 2 hours.  You keep throwing up (vomiting).  Your pain is felt only in portions of the abdomen, such as the right side or the left lower portion of the abdomen.  You pass bloody or black tarry stools. MAKE SURE YOU:  Understand these instructions.  Will watch your condition.  Will get help right away if you are not doing well or get worse.   This information is not intended to replace advice given to you by your health care provider. Make sure you discuss any questions you have with your health care provider.   Document Released: 12/15/2004 Document Revised: 11/26/2014 Document Reviewed: 11/14/2012 Elsevier Interactive Patient Education [employer] Inc[employer]

## 2015-08-09 NOTE — ED Provider Notes (Signed)
CSN: [id number]     Arrival date & time 08/09/15  1319 History   First MD Initiated Contact with Patient 08/09/15 1409     Chief Complaint  Patient presents with  . Abdominal Pain     (Consider location/radiation/quality/duration/timing/severity/associated sxs/prior Treatment) HPI  30 year old male presents with sudden, severe pain in his epigastrium. It started last night around 8 PM. [facility] scan blood work evaluated. He states he was given IV pain medicine that took his pain down. He was sent home on Percocet and nausea medicine. He states that the pain is better, down to a 5 or 7 from an 8 or 9 whenever he is on the pain medicine. However this isn't wears off the pain gets worse. No vomiting since being discharged home. He is also not eaten or drank anything. No fevers. Patient states he chronically drinks 4 or 5 mixed drinks per night. Sometimes has more. This is a daily occurrence. No prior history of pancreatitis. Sometimes feels like a cramping or sharp pain.  History reviewed. No pertinent past medical history. History reviewed. No pertinent past surgical history. Family History  Problem Relation Age of Onset  . Cancer Paternal Grandmother    Social History  Substance Use Topics  . Smoking status: Never Smoker   . Smokeless tobacco: Never Used  . Alcohol Use: 0.0 oz/week    0 Standard drinks or equivalent per week    Review of Systems  Constitutional: Negative for fever.  Gastrointestinal: Positive for abdominal pain. Negative for nausea, vomiting and diarrhea.  Musculoskeletal: Negative for back pain.  All other systems reviewed and are negative.     Allergies  Penicillins  Home Medications   Prior to Admission medications   Medication Sig Start Date End Date Taking? Authorizing Provider  ondansetron (ZOFRAN) 4 MG tablet Take 1 tablet (4 mg total) by mouth every 6 (six) hours. 08/08/15   Emi HolesAlexandra M Law, PA-C   oxyCODONE-acetaminophen (PERCOCET/ROXICET) 5-325 MG tablet Take 1-2 tablets by mouth every 4 (four) hours as needed for severe pain. 08/08/15   Alexandra M Law, PA-C   BP 142/90 mmHg  Pulse 98  Temp(Src) 98.4 F (36.9 C) (Oral)  Resp 20  SpO2 100% Physical Exam  Constitutional: He is oriented to person, place, and time. He appears well-developed and well-nourished.  HENT:  Head: Normocephalic and atraumatic.  Right Ear: External ear normal.  Left Ear: External ear normal.  Nose: Nose normal.  Eyes: Right eye exhibits no discharge. Left eye exhibits no discharge.  Neck: Neck supple.  Cardiovascular: Normal rate, regular rhythm, normal heart sounds and intact distal pulses.   Pulmonary/Chest: Effort normal and breath sounds normal.  Abdominal: Soft. He exhibits no distension. There is tenderness (epigastric) in the epigastric area.  Musculoskeletal: He exhibits no edema.  Neurological: He is alert and oriented to person, place, and time.  Skin: Skin is warm and dry.  Nursing note and vitals reviewed.   ED Course  Procedures (including critical care time) Labs Review Labs Reviewed  LIPASE, BLOOD - Abnormal; Notable for the following:    Lipase 105 (*)    All other components within normal limits  COMPREHENSIVE METABOLIC PANEL - Abnormal; Notable for the following:    Glucose, Bld 115 (*)    All other components within normal limits  CBC - Abnormal; Notable for the following:    WBC 15.5 (*)    All other components within normal limits  Imaging Review Ct Abdomen Pelvis W Contrast  08/08/2015  CLINICAL DATA:  30 year old presenting with acute onset of severe right lower quadrant abdominal pain associated with nausea. Leukocytosis with white blood count of 16109. EXAM: CT ABDOMEN AND PELVIS WITH CONTRAST TECHNIQUE: Multidetector CT imaging of the abdomen and pelvis was performed using the standard protocol following bolus administration of intravenous contrast. CONTRAST:   ISOVUE-300 IOPAMIDOL 61% IV. Oral contrast was also administered. COMPARISON:  None. FINDINGS: Lower chest:  Heart size normal.  Visualized lung bases clear. Hepatobiliary: Liver normal in size and appearance. Gallbladder mildly distended without calcified gallstones. No pericholecystic edema/inflammation. No biliary ductal dilation. Pancreas: Normal in appearance without evidence of mass, ductal dilation, or inflammation. Spleen: Normal in size and appearance. Adrenals/Urinary Tract: Normal appearing adrenal glands. Kidneys normal in size and appearance without focal parenchymal abnormality. No evidence of urinary tract calculi or obstruction. Normal-appearing urinary bladder. Stomach/Bowel: Stomach normal in appearance for the degree of distention. Normal-appearing small bowel. Expected stool burden throughout normal appearing, relatively decompressed colon. Normal appendix in the right upper pelvis. Vascular/Lymphatic: No visible aortoiliofemoral atherosclerosis. Widely patent visceral arteries. Normal-appearing portal venous and systemic venous systems. No pathologic lymphadenopathy. Reproductive: Prostate gland and seminal vesicles normal in size and appearance for age. Other: None. Musculoskeletal: Regional skeleton intact without acute or significant osseous abnormality. IMPRESSION: 1. Mildly distended gallbladder without evidence of cholelithiasis or acute cholecystitis. 2. Otherwise normal examination. Electronically Signed   By: Hulan Saas M.D.   On: 08/08/2015 23:35   I have personally reviewed and evaluated these images and lab results as part of my medical decision-making.   EKG Interpretation None      MDM   Final diagnoses:  Acute pancreatitis, unspecified complication status, unspecified pancreatitis type    Patient with continued abdominal pain. VSS. Has elevated WBC but no other SIRS criteria. Likely mild pancreatitis, but patient's CT yesterday was unremarkable. Probably  coming from chronic alcohol abuse. I recommended seeking help to quit alcohol. He does not appear in distress. While he has pain he is not currently vomiting and is drinking water without difficulty. Will give IV pain meds and fluids but I feel he is stable for discharge and will recommend clear liquid diet for 3 days. Stressed importance of finding a PCP. Discussed return precautions. Will also give a PPI.     Pricilla Loveless, MD 08/09/15 [phone]

## 2015-08-09 NOTE — ED Notes (Signed)
3rd ER visit in 24 hrs. States he was dx with Pancreatitis. Pain continues to return. Emesis x 4 since 5pm.

## 2015-08-10 ENCOUNTER — Inpatient Hospital Stay (HOSPITAL_COMMUNITY): Payer: Managed Care, Other (non HMO)

## 2015-08-10 ENCOUNTER — Encounter (HOSPITAL_COMMUNITY): Payer: Self-pay | Admitting: *Deleted

## 2015-08-10 DIAGNOSIS — K861 Other chronic pancreatitis: Secondary | ICD-10-CM | POA: Diagnosis present

## 2015-08-10 DIAGNOSIS — R109 Unspecified abdominal pain: Secondary | ICD-10-CM | POA: Diagnosis not present

## 2015-08-10 DIAGNOSIS — K567 Ileus, unspecified: Secondary | ICD-10-CM | POA: Diagnosis not present

## 2015-08-10 DIAGNOSIS — R1013 Epigastric pain: Secondary | ICD-10-CM | POA: Diagnosis present

## 2015-08-10 DIAGNOSIS — K219 Gastro-esophageal reflux disease without esophagitis: Secondary | ICD-10-CM | POA: Diagnosis present

## 2015-08-10 DIAGNOSIS — K852 Alcohol induced acute pancreatitis without necrosis or infection: Secondary | ICD-10-CM | POA: Diagnosis present

## 2015-08-10 DIAGNOSIS — F1020 Alcohol dependence, uncomplicated: Secondary | ICD-10-CM | POA: Diagnosis present

## 2015-08-10 DIAGNOSIS — K859 Acute pancreatitis without necrosis or infection, unspecified: Secondary | ICD-10-CM | POA: Diagnosis present

## 2015-08-10 DIAGNOSIS — D72829 Elevated white blood cell count, unspecified: Secondary | ICD-10-CM | POA: Diagnosis not present

## 2015-08-10 DIAGNOSIS — R509 Fever, unspecified: Secondary | ICD-10-CM | POA: Diagnosis not present

## 2015-08-10 DIAGNOSIS — K5900 Constipation, unspecified: Secondary | ICD-10-CM | POA: Diagnosis present

## 2015-08-10 LAB — RAPID URINE DRUG SCREEN, HOSP PERFORMED
AMPHETAMINES: NOT DETECTED
BARBITURATES: NOT DETECTED
Benzodiazepines: NOT DETECTED
COCAINE: NOT DETECTED
Opiates: POSITIVE — AB
TETRAHYDROCANNABINOL: NOT DETECTED

## 2015-08-10 LAB — BASIC METABOLIC PANEL
Anion gap: 8 (ref 5–15)
BUN: 9 mg/dL (ref 6–20)
CALCIUM: 8.4 mg/dL — AB (ref 8.9–10.3)
CHLORIDE: 102 mmol/L (ref 101–111)
CO2: 25 mmol/L (ref 22–32)
CREATININE: 1.04 mg/dL (ref 0.61–1.24)
GFR calc Af Amer: >60 mL/min (ref >60)
GFR calc non Af Amer: >60 mL/min (ref >60)
GLUCOSE: 120 mg/dL — AB (ref 65–99)
Potassium: 4.1 mmol/L (ref 3.5–5.1)
Sodium: 135 mmol/L (ref 135–145)

## 2015-08-10 LAB — CBC WITH DIFFERENTIAL/PLATELET
BASOS PCT: 0 %
Basophils Absolute: 0 10*3/uL (ref 0.0–0.1)
Eosinophils Absolute: 0 10*3/uL (ref 0.0–0.7)
Eosinophils Relative: 0 %
HEMATOCRIT: 41.8 % (ref 39.0–52.0)
HEMOGLOBIN: 13.6 g/dL (ref 13.0–17.0)
LYMPHS ABS: 1.3 10*3/uL (ref 0.7–4.0)
Lymphocytes Relative: 7 %
MCH: 29.8 pg (ref 26.0–34.0)
MCHC: 32.5 g/dL (ref 30.0–36.0)
MCV: 91.7 fL (ref 78.0–100.0)
MONOS PCT: 7 %
Monocytes Absolute: 1.2 10*3/uL — ABNORMAL HIGH (ref 0.1–1.0)
NEUTROS ABS: 16.3 10*3/uL — AB (ref 1.7–7.7)
NEUTROS PCT: 86 %
Platelets: 244 10*3/uL (ref 150–400)
RBC: 4.56 MIL/uL (ref 4.22–5.81)
RDW: 13.1 % (ref 11.5–15.5)
WBC: 18.9 10*3/uL — ABNORMAL HIGH (ref 4.0–10.5)

## 2015-08-10 LAB — HEPATIC FUNCTION PANEL
ALBUMIN: 3.5 g/dL (ref 3.5–5.0)
ALT: 31 U/L (ref 17–63)
AST: 23 U/L (ref 15–41)
Alkaline Phosphatase: 36 U/L — ABNORMAL LOW (ref 38–126)
BILIRUBIN INDIRECT: 1 mg/dL — AB (ref 0.3–0.9)
Bilirubin, Direct: 0.3 mg/dL (ref 0.1–0.5)
Total Bilirubin: 1.3 mg/dL — ABNORMAL HIGH (ref 0.3–1.2)
Total Protein: 6 g/dL — ABNORMAL LOW (ref 6.5–8.1)

## 2015-08-10 LAB — URINALYSIS, ROUTINE W REFLEX MICROSCOPIC
Bilirubin Urine: NEGATIVE
GLUCOSE, UA: NEGATIVE mg/dL
Hgb urine dipstick: NEGATIVE
KETONES UR: 15 mg/dL — AB
LEUKOCYTES UA: NEGATIVE
NITRITE: NEGATIVE
PH: 6.5 (ref 5.0–8.0)
Protein, ur: NEGATIVE mg/dL
SPECIFIC GRAVITY, URINE: 1.015 (ref 1.005–1.030)

## 2015-08-10 LAB — LIPID PANEL
CHOLESTEROL: 144 mg/dL (ref 0–200)
HDL: 46 mg/dL (ref >40)
LDL Cholesterol: 88 mg/dL (ref 0–99)
TRIGLYCERIDES: 50 mg/dL (ref <150)
Total CHOL/HDL Ratio: 3.1 RATIO
VLDL: 10 mg/dL (ref 0–40)

## 2015-08-10 LAB — LACTIC ACID, PLASMA: Lactic Acid, Venous: 0.9 mmol/L (ref 0.5–2.0)

## 2015-08-10 LAB — LIPASE, BLOOD: Lipase: 80 U/L — ABNORMAL HIGH (ref 11–51)

## 2015-08-10 LAB — GLUCOSE, CAPILLARY: Glucose-Capillary: 103 mg/dL — ABNORMAL HIGH (ref 65–99)

## 2015-08-10 LAB — TROPONIN I

## 2015-08-10 MED ORDER — ONDANSETRON HCL 4 MG/2ML IJ SOLN
4.0000 mg | Freq: Four times a day (QID) | INTRAMUSCULAR | Status: DC | PRN
  Administered 2015-08-11: 4 mg via INTRAVENOUS
  Filled 2015-08-10 (×2): qty 2

## 2015-08-10 MED ORDER — ACETAMINOPHEN 650 MG RE SUPP: 650.0000 mg | Freq: Four times a day (QID) | RECTAL | Status: DC | PRN

## 2015-08-10 MED ORDER — PANTOPRAZOLE SODIUM 40 MG IV SOLR
40.0000 mg | INTRAVENOUS | Status: DC
  Administered 2015-08-10 – 2015-08-13 (×4): 40 mg via INTRAVENOUS
  Filled 2015-08-10 (×4): qty 40

## 2015-08-10 MED ORDER — LORAZEPAM 1 MG PO TABS: 1.0000 mg | ORAL_TABLET | Freq: Four times a day (QID) | ORAL | Status: AC | PRN

## 2015-08-10 MED ORDER — ADULT MULTIVITAMIN W/MINERALS CH
1.0000 | ORAL_TABLET | Freq: Every day | ORAL | Status: DC
  Administered 2015-08-12 – 2015-08-15 (×4): 1 via ORAL
  Filled 2015-08-10 (×4): qty 1

## 2015-08-10 MED ORDER — SODIUM CHLORIDE 0.9 % IV SOLN
INTRAVENOUS | Status: DC
  Administered 2015-08-10 (×2): via INTRAVENOUS

## 2015-08-10 MED ORDER — FOLIC ACID 1 MG PO TABS
1.0000 mg | ORAL_TABLET | Freq: Every day | ORAL | Status: DC
  Administered 2015-08-12 – 2015-08-15 (×4): 1 mg via ORAL
  Filled 2015-08-10 (×4): qty 1

## 2015-08-10 MED ORDER — VITAMIN B-1 100 MG PO TABS
100.0000 mg | ORAL_TABLET | Freq: Every day | ORAL | Status: DC
  Administered 2015-08-12 – 2015-08-15 (×4): 100 mg via ORAL
  Filled 2015-08-10 (×4): qty 1

## 2015-08-10 MED ORDER — ONDANSETRON HCL 4 MG PO TABS: 4.0000 mg | ORAL_TABLET | Freq: Four times a day (QID) | ORAL | Status: DC | PRN

## 2015-08-10 MED ORDER — SODIUM CHLORIDE 0.9 % IV SOLN: INTRAVENOUS | Status: DC

## 2015-08-10 MED ORDER — ONDANSETRON HCL 4 MG/2ML IJ SOLN
4.0000 mg | Freq: Three times a day (TID) | INTRAMUSCULAR | Status: DC | PRN
  Administered 2015-08-10: 4 mg via INTRAVENOUS
  Filled 2015-08-10: qty 2

## 2015-08-10 MED ORDER — LORAZEPAM 2 MG/ML IJ SOLN
0.0000 mg | Freq: Four times a day (QID) | INTRAMUSCULAR | Status: AC
  Administered 2015-08-10 – 2015-08-12 (×2): 1 mg via INTRAVENOUS
  Filled 2015-08-10 (×3): qty 1

## 2015-08-10 MED ORDER — FENTANYL CITRATE (PF) 100 MCG/2ML IJ SOLN
50.0000 ug | INTRAMUSCULAR | Status: DC | PRN
  Administered 2015-08-10: 50 ug via INTRAVENOUS
  Filled 2015-08-10: qty 2

## 2015-08-10 MED ORDER — FENTANYL CITRATE (PF) 100 MCG/2ML IJ SOLN
50.0000 ug | INTRAMUSCULAR | Status: DC | PRN
  Administered 2015-08-10 – 2015-08-12 (×25): 50 ug via INTRAVENOUS
  Filled 2015-08-10 (×26): qty 2

## 2015-08-10 MED ORDER — LORAZEPAM 2 MG/ML IJ SOLN: 0.0000 mg | Freq: Two times a day (BID) | INTRAMUSCULAR | Status: AC

## 2015-08-10 MED ORDER — SODIUM CHLORIDE 0.9 % IV SOLN
INTRAVENOUS | Status: AC
  Administered 2015-08-10: 01:00:00 via INTRAVENOUS

## 2015-08-10 MED ORDER — ENOXAPARIN SODIUM 40 MG/0.4ML ~~LOC~~ SOLN
40.0000 mg | SUBCUTANEOUS | Status: DC
Start: 2015-08-10 — End: 2015-08-15
  Administered 2015-08-10 – 2015-08-14 (×5): 40 mg via SUBCUTANEOUS
  Filled 2015-08-10 (×5): qty 0.4

## 2015-08-10 MED ORDER — LORAZEPAM 2 MG/ML IJ SOLN
1.0000 mg | Freq: Four times a day (QID) | INTRAMUSCULAR | Status: AC | PRN
  Administered 2015-08-10: 0.5 mg via INTRAVENOUS
  Administered 2015-08-12 – 2015-08-13 (×2): 1 mg via INTRAVENOUS
  Filled 2015-08-10 (×2): qty 1

## 2015-08-10 MED ORDER — THIAMINE HCL 100 MG/ML IJ SOLN
100.0000 mg | Freq: Every day | INTRAMUSCULAR | Status: DC
  Administered 2015-08-10 – 2015-08-11 (×2): 100 mg via INTRAVENOUS
  Filled 2015-08-10 (×2): qty 2

## 2015-08-10 MED ORDER — ACETAMINOPHEN 325 MG PO TABS
650.0000 mg | ORAL_TABLET | Freq: Four times a day (QID) | ORAL | Status: DC | PRN
  Administered 2015-08-10 – 2015-08-11 (×3): 650 mg via ORAL
  Filled 2015-08-10 (×4): qty 2

## 2015-08-10 NOTE — Progress Notes (Signed)
[facility]. Dx: acute alcoholic pancreatitis. Intractable nausea, vomiting, and abdominal pain. [facility]. CT A/P with contrast yesterday did not show concerning changes around the pancreatitis, but he has a leukocytosis of 20. Being admitted for bowel rest, IV fluid resuscitation, and symptom management. Should arrive on NS at 125cc/hr. IV fentanly and zofran given in the ED. Accepted to med surg bed, inpatient status.

## 2015-08-10 NOTE — H&P (Signed)
History and Physical    Kerry Fernandez ZOX:[id number] DOB: 1985/12/20 DOA: 08/09/2015  PCP: Elvina Sidle, MD  Patient coming from: Home.  Chief Complaint: Abdominal pain and nausea vomiting.  HPI: Kerry Fernandez is a 30 y.o. male with medical history significant of alcoholism presents to the ER third time in the last 2 days with abdominal pain and nausea vomiting. Patient started having abdominal pain mostly in the epigastric area sharp radiating the back with multiple episodes of nausea and vomiting. Denies any diarrhea. Denies any new medication intake or any sick contacts or recent travel. Patient states he drinks a lot of alcohol for last many years. During his prior visit had a CT abdomen which shows gallbladder fullness but nothing acute. Lipase was elevated. Since patient had persistent abdominal pain and this is the third visit and labs show mildly elevated lipase and worsening leukocytosis patient has been admitted for abdominal pain most likely from a chronic pancreatitis. Denies any chest pain shortness of breath productive cough fever or chills.   ED Course: Was started on IV fluids and pain relief medications.  Review of Systems: As per HPI otherwise 10 point review of systems negative.    Past Medical History  Diagnosis Date  . Pancreatitis, alcoholic, acute 08/08/2015  . GERD (gastroesophageal reflux disease)     History reviewed. No pertinent past surgical history.   reports that he has never smoked. He has never used smokeless tobacco. He reports that he drinks alcohol. He reports that he does not use illicit drugs.  Allergies  Allergen Reactions  . Penicillins Other (See Comments)    Unknown childhood allergic reaction    Family History  Problem Relation Age of Onset  . Cancer Paternal Grandmother     Prior to Admission medications   Medication Sig Start Date End Date Taking? Authorizing Provider  Ascorbic Acid (VITAMIN C) 1000 MG tablet Take 2,000 mg  by mouth 4 (four) times daily as needed (immune system boost).    Historical Provider, MD  famotidine (PEPCID) 20 MG tablet Take 1 tablet (20 mg total) by mouth 2 (two) times daily. 08/09/15   Pricilla Loveless, MD  ibuprofen (ADVIL,MOTRIN) 200 MG tablet Take 600 mg by mouth every 6 (six) hours as needed (pain).    Historical Provider, MD  ondansetron (ZOFRAN) 4 MG tablet Take 1 tablet (4 mg total) by mouth every 6 (six) hours. Patient taking differently: Take 4 mg by mouth every 6 (six) hours as needed for nausea or vomiting.  08/08/15   Emi Holes, PA-C  oxyCODONE-acetaminophen (PERCOCET/ROXICET) 5-325 MG tablet Take 1-2 tablets by mouth every 4 (four) hours as needed for severe pain. 08/08/15   Emi Holes, PA-C  pantoprazole (PROTONIX) 40 MG tablet Take 1 tablet (40 mg total) by mouth daily. 08/09/15   Pricilla Loveless, MD  Tetrahydrozoline HCl (VISINE OP) Place 1 drop into both eyes daily as needed (dry eyes).    Historical Provider, MD    Physical Exam: Filed Vitals:   08/10/15 0200 08/10/15 0215 08/10/15 0220 08/10/15 0322  BP: 113/78 125/84  105/86  Pulse: 89 93  103  Temp:   99.6 F (37.6 C) 99.6 F (37.6 C)  TempSrc:   Oral   Resp:    17  Height:    5\' 11"  (1.803 m)  Weight:    189 lb (85.73 kg)  SpO2: 96% 98%  97%      Constitutional: Not in distress. Filed Vitals:   08/10/15  0200 08/10/15 0215 08/10/15 0220 08/10/15 0322  BP: 113/78 125/84  105/86  Pulse: 89 93  103  Temp:   99.6 F (37.6 C) 99.6 F (37.6 C)  TempSrc:   Oral   Resp:    17  Height:    5\' 11"  (1.803 m)  Weight:    189 lb (85.73 kg)  SpO2: 96% 98%  97%   Eyes: Anicteric no pallor. ENMT: No discharge from the ears eyes nose or mouth. Neck: No mass felt. No neck rigidity. Respiratory: No rhonchi or crepitations. Cardiovascular: S1 and S2 heard. Abdomen: Epigastric tenderness mildly rigid no guarding. Bowel sounds not appreciated. Musculoskeletal: No edema. Skin: No rash. Neurologic: Alert awake  oriented to time place and person. Moves all extremities. Psychiatric: Appears normal.   Labs on Admission: I have personally reviewed following labs and imaging studies  CBC:  Recent Labs Lab 08/08/15 2141 08/09/15 1344 08/09/15 2320  WBC 14.1* 15.5* 20.7*  NEUTROABS  --   --  18.7*  HGB 16.1 16.0 15.4  HCT 45.0 46.6 43.3  MCV 88.1 87.9 88.5  PLT 299 273 260   Basic Metabolic Panel:  Recent Labs Lab 08/08/15 2141 08/09/15 1344 08/09/15 2320  NA 137 137 134*  K 3.3* 3.7 4.2  CL 102 101 103  CO2 22 26 22   GLUCOSE 136* 115* 124*  BUN 15 8 12   CREATININE 1.13 0.98 0.83  CALCIUM 9.1 9.7 8.8*   GFR: Estimated Creatinine Clearance: 138.6 mL/min (by C-G formula based on Cr of 0.83). Liver Function Tests:  Recent Labs Lab 08/08/15 2141 08/09/15 1344 08/09/15 2320  AST 43* 29 28  ALT 48 42 35  ALKPHOS 45 48 41  BILITOT 0.6 1.2 1.5*  PROT 7.6 7.0 7.0  ALBUMIN 4.7 4.2 4.2    Recent Labs Lab 08/08/15 2141 08/09/15 1344 08/09/15 2320  LIPASE 183* 105* 105*   No results for input(s): AMMONIA in the last 168 hours. Coagulation Profile: No results for input(s): INR, PROTIME in the last 168 hours. Cardiac Enzymes: No results for input(s): CKTOTAL, CKMB, CKMBINDEX, TROPONINI in the last 168 hours. BNP (last 3 results) No results for input(s): PROBNP in the last 8760 hours. HbA1C: No results for input(s): HGBA1C in the last 72 hours. CBG: No results for input(s): GLUCAP in the last 168 hours. Lipid Profile: No results for input(s): CHOL, HDL, LDLCALC, TRIG, CHOLHDL, LDLDIRECT in the last 72 hours. Thyroid Function Tests: No results for input(s): TSH, T4TOTAL, FREET4, T3FREE, THYROIDAB in the last 72 hours. Anemia Panel: No results for input(s): VITAMINB12, FOLATE, FERRITIN, TIBC, IRON, RETICCTPCT in the last 72 hours. Urine analysis:    Component Value Date/Time   COLORURINE YELLOW 08/08/2015 2130   APPEARANCEUR CLEAR 08/08/2015 2130   LABSPEC 1.012  08/08/2015 2130   PHURINE 6.0 08/08/2015 2130   GLUCOSEU NEGATIVE 08/08/2015 2130   HGBUR NEGATIVE 08/08/2015 2130   BILIRUBINUR NEGATIVE 08/08/2015 2130   BILIRUBINUR neg 05/06/2014 1038   KETONESUR NEGATIVE 08/08/2015 2130   PROTEINUR NEGATIVE 08/08/2015 2130   PROTEINUR neg 05/06/2014 1038   UROBILINOGEN 0.2 05/06/2014 1038   NITRITE NEGATIVE 08/08/2015 2130   NITRITE neg 05/06/2014 1038   LEUKOCYTESUR NEGATIVE 08/08/2015 2130   Sepsis Labs: @LABRCNTIP (procalcitonin:4,lacticidven:4) )No results found for this or any previous visit (from the past 240 hour(s)).   Radiological Exams on Admission: Ct Abdomen Pelvis W Contrast  08/08/2015  CLINICAL DATA:  30 year old presenting with acute onset of severe right lower quadrant abdominal pain associated with  nausea. Leukocytosis with [place] blood [place] of [address]. EXAM: CT ABDOMEN AND PELVIS WITH CONTRAST TECHNIQUE: Multidetector CT imaging of the abdomen and pelvis was performed using the standard protocol following bolus administration of intravenous contrast. CONTRAST:  ISOVUE-300 IOPAMIDOL 61% IV. Oral contrast was also administered. COMPARISON:  None. FINDINGS: Lower chest:  Heart size normal.  Visualized lung bases clear. Hepatobiliary: Liver normal in size and appearance. Gallbladder mildly distended without calcified gallstones. No pericholecystic edema/inflammation. No biliary ductal dilation. Pancreas: Normal in appearance without evidence of mass, ductal dilation, or inflammation. Spleen: Normal in size and appearance. Adrenals/Urinary Tract: Normal appearing adrenal glands. Kidneys normal in size and appearance without focal parenchymal abnormality. No evidence of urinary tract calculi or obstruction. Normal-appearing urinary bladder. Stomach/Bowel: Stomach normal in appearance for the degree of distention. Normal-appearing small bowel. Expected stool burden throughout normal appearing, relatively decompressed colon. Normal appendix in  the right upper pelvis. Vascular/Lymphatic: No visible aortoiliofemoral atherosclerosis. Widely patent visceral arteries. Normal-appearing portal venous and systemic venous systems. No pathologic lymphadenopathy. Reproductive: Prostate gland and seminal vesicles normal in size and appearance for age. Other: None. Musculoskeletal: Regional skeleton intact without acute or significant osseous abnormality. IMPRESSION: 1. Mildly distended gallbladder without evidence of cholelithiasis or acute cholecystitis. 2. Otherwise normal examination. Electronically Signed   By: Hulan Saas M.D.   On: 08/08/2015 23:35     Assessment/Plan Principal Problem:   Acute alcoholic pancreatitis Active Problems:   Acute pancreatitis    #1. Acute alcoholic pancreatitis - given the location of pain mildly elevated lipase and history of alcoholism patient abdominal pain most likely from alcoholic pancreatitis. Other differential could be alcoholic gastritis. Since patient still has pain and abdomen is mildly rigid on exam I have ordered acute abdominal series. Keep patient nothing by mouth IV fluids pain relief medications. Recheck labs including LFTs and lipase troponin. Since patient's recent CAT scan showed gallbladder fullness I ordered sonogram of the abdomen. Closely follow intake and output metabolic panel. #2. Alcoholism - patient was advised to quit alcohol abuse. Patient is on CIWA protocol. IV Ativan. #3. Leukocytosis - probably reactionary however patient's WBC count is decreasing and patient is mildly febrile will check lactate and blood cultures. Follow acute abdominal series. Check UA.   DVT prophylaxis: Lovenox. Code Status: Full code.  Family Communication: No family at the bedside.  Disposition Plan: Home.  Consults called: None.  Admission status: Inpatient. Telemetry. Likely stay 2 days.    Eduard Clos MD [facility] Pager [phone].  If 7PM-7AM, please contact  night-coverage [email or web address] Password [facility]  08/10/2015, 5:13 AM

## 2015-08-10 NOTE — Progress Notes (Signed)
PROGRESS NOTE  Kerry Fernandez  ZOX:[id number] DOB: Dec 12, 1985  DOA: 08/09/2015 PCP: Elvina Sidle, MD   Brief Narrative:  30 year old male with history of alcohol dependence, GERD, presented to the [facility] [place] for the third time in 48 hours for complaints of abdominal pain and nonbloody emesis. He last consumed alcohol on 08/07/17 and started having abdominal pain the same evening. CT abdomen 5/20: Mildly distended gallbladder without evidence of cholelithiasis or acute cholecystitis otherwise normal exam (pancreas reported as normal), initial lipase 183 and unremarkable LFTs. Patient transferred to [facility] for management of acute pancreatitis. [facility] was consulted.   Assessment & Plan:   Principal Problem:   Acute alcoholic pancreatitis Active Problems:   Acute pancreatitis   Acute alcoholic pancreatitis - CT abdomen: No acute findings and pancreas reported as normal. Abdominal ultrasound: Unremarkable. Initial lipase: 183. LFTs unremarkable. - Since imaging was unremarkable and lipase was only mildly elevated, there was concern that his presentation was related to some other etiology i.e. GERD with acute esophagitis, gastritis, PUD etc. Hence [facility] was consulted. Discussed with Dr.Buccini who favors pancreatitis. - Continue supportive care with nothing by mouth/bowel rest, IV fluids and pain medications. - Triglycerides: 50. Lactate normal. - KUB suggestive of ileus  Alcohol dependence - continue CIWA protocol including scheduled Ativan. At risk for withdrawal.  Leukocytosis - Likely secondary to pancreatitis. Follow CBCs   DVT prophylaxis: Lovenox Code Status: Full Family Communication: Discussed extensively with patient's spouse at bedside. Disposition Plan: DC home when medically stable, possibly in the next 3-4 days.   Consultants:   [facility]  Procedures:   None  Antimicrobials:   None    Subjective: States that he feels better and pain medications  are controlling pain. No further nausea or vomiting reported. Normal BM yesterday. No reported coffee ground emesis or melena.  Objective:  Filed Vitals:   08/10/15 0220 08/10/15 0322 08/10/15 0645 08/10/15 0908  BP:  105/86  129/73  Pulse:  103 105 98  Temp: 99.6 F (37.6 C) 99.6 F (37.6 C)  99.3 F (37.4 C)  TempSrc: Oral   Oral  Resp:  17  18  Height:   (1.803 m)    Weight:  85.73 kg (189 lb)    SpO2:  97%  98%    Intake/Output Summary (Last 24 hours) at 08/10/15 1458 Last data filed at 08/10/15 1403  Gross per 24 hour  Intake 3107.92 ml  Output    625 ml  Net 2482.92 ml   Filed Weights   08/09/15 2048 08/10/15 0322  Weight: 81.647 kg (180 lb) 85.73 kg (189 lb)    Examination:  General exam: Pleasant young male lying comfortably propped up in bed. Respiratory system: Clear to auscultation. Respiratory effort normal. Cardiovascular system: S1 & S2 heard, RRR. No JVD, murmurs, rubs, gallops or clicks. No pedal edema. Gastrointestinal system: Abdomen is mildly distended, soft and mild epigastric tenderness without peritoneal signs. No organomegaly or masses felt. Normal bowel sounds heard. Central nervous system: Alert and oriented. No focal neurological deficits. Extremities: Symmetric 5 x 5 power. Skin: No rashes, lesions or ulcers Psychiatry: Judgement and insight appear normal. Mood & affect appropriate.     Data Reviewed: I have personally reviewed following labs and imaging studies  CBC:  Recent Labs Lab 08/08/15 2141 08/09/15 1344 08/09/15 2320 08/10/15 0529  WBC 14.1* 15.5* 20.7* 18.9*  NEUTROABS  --   --  18.7* 16.3*  HGB 16.1 16.0 15.4 13.6  HCT  45.0 46.6 43.3 41.8  MCV 88.1 87.9 88.5 91.7  PLT 299 273 260 244   Basic Metabolic Panel:  Recent Labs Lab 08/08/15 2141 08/09/15 1344 08/09/15 2320 08/10/15 0529  NA 137 137 134* 135  K 3.3* 3.7 4.2 4.1  CL 102 101 103 102  CO2 22 26 22 25   GLUCOSE 136* 115* 124* 120*  BUN 15 8 12 9     CREATININE 1.13 0.98 0.83 1.04  CALCIUM 9.1 9.7 8.8* 8.4*   GFR: Estimated Creatinine Clearance: 110.6 mL/min (by C-G formula based on Cr of 1.04). Liver Function Tests:  Recent Labs Lab 08/08/15 2141 08/09/15 1344 08/09/15 2320 08/10/15 0529  AST 43* 29 28 23   ALT 48 42 35 31  ALKPHOS 45 48 41 36*  BILITOT 0.6 1.2 1.5* 1.3*  PROT 7.6 7.0 7.0 6.0*  ALBUMIN 4.7 4.2 4.2 3.5    Recent Labs Lab 08/08/15 2141 08/09/15 1344 08/09/15 2320 08/10/15 0529  LIPASE 183* 105* 105* 80*   No results for input(s): AMMONIA in the last 168 hours. Coagulation Profile: No results for input(s): INR, PROTIME in the last 168 hours. Cardiac Enzymes:  Recent Labs Lab 08/10/15 0529  TROPONINI <0.03   BNP (last 3 results) No results for input(s): PROBNP in the last 8760 hours. HbA1C: No results for input(s): HGBA1C in the last 72 hours. CBG:  Recent Labs Lab 08/10/15 1301  GLUCAP 103*   Lipid Profile:  Recent Labs  08/10/15 0524  CHOL 144  HDL 46  LDLCALC 88  TRIG 50  CHOLHDL 3.1   Thyroid Function Tests: No results for input(s): TSH, T4TOTAL, FREET4, T3FREE, THYROIDAB in the last 72 hours. Anemia Panel: No results for input(s): VITAMINB12, FOLATE, FERRITIN, TIBC, IRON, RETICCTPCT in the last 72 hours.  Sepsis Labs:  Recent Labs Lab 08/10/15 0524  LATICACIDVEN 0.9    No results found for this or any previous visit (from the past 240 hour(s)).       Radiology Studies: [place] Abdomen Complete  08/10/2015  CLINICAL DATA:  Epigastric pain.  History of pancreatitis. EXAM: ABDOMEN ULTRASOUND COMPLETE COMPARISON:  CT abdomen and pelvis 08/08/2015. FINDINGS: Gallbladder: No gallstones or wall thickening visualized. No sonographic Murphy sign noted by sonographer. Common bile duct: Diameter: 6.2 mm, upper limits normal. Liver: No focal lesion identified. Within normal limits in parenchymal echogenicity. IVC: No abnormality visualized. Pancreas: Obscured by gas. Spleen:  Size and appearance within normal limits. Right Kidney: Length: 12.5 cm. Echogenicity within normal limits. No mass or hydronephrosis visualized. Left Kidney: Length: 12.0 cm. Echogenicity within normal limits. No mass or hydronephrosis visualized. Abdominal aorta: 2.3 cm. Other findings: None. IMPRESSION: Unremarkable abdominal ultrasound. Electronically Signed   By: Elsie StainJohn T Curnes M.D.   On: 08/10/2015 07:35   Ct Abdomen Pelvis W Contrast  08/08/2015  CLINICAL DATA:  30 year old presenting with acute onset of severe right lower quadrant abdominal pain associated with nausea. Leukocytosis with white blood count of [id number]. EXAM: CT ABDOMEN AND PELVIS WITH CONTRAST TECHNIQUE: Multidetector CT imaging of the abdomen and pelvis was performed using the standard protocol following bolus administration of intravenous contrast. CONTRAST:  100mL ISOVUE-300 IOPAMIDOL 61% IV. Oral contrast was also administered. COMPARISON:  None. FINDINGS: Lower chest:  Heart size normal.  Visualized lung bases clear. Hepatobiliary: Liver normal in size and appearance. Gallbladder mildly distended without calcified gallstones. No pericholecystic edema/inflammation. No biliary ductal dilation. Pancreas: Normal in appearance without evidence of mass, ductal dilation, or inflammation. Spleen: Normal in size and appearance. Adrenals/Urinary  Tract: Normal appearing adrenal glands. Kidneys normal in size and appearance without focal parenchymal abnormality. No evidence of urinary tract calculi or obstruction. Normal-appearing urinary bladder. Stomach/Bowel: Stomach normal in appearance for the degree of distention. Normal-appearing small bowel. Expected stool burden throughout normal appearing, relatively decompressed colon. Normal appendix in the right upper pelvis. Vascular/Lymphatic: No visible aortoiliofemoral atherosclerosis. Widely patent visceral arteries. Normal-appearing portal venous and systemic venous systems. No pathologic  lymphadenopathy. Reproductive: Prostate gland and seminal vesicles normal in size and appearance for age. Other: None. Musculoskeletal: Regional skeleton intact without acute or significant osseous abnormality. IMPRESSION: 1. Mildly distended gallbladder without evidence of cholelithiasis or acute cholecystitis. 2. Otherwise normal examination. Electronically Signed   By: Hulan Saas M.D.   On: 08/08/2015 23:35   Dg Abd Acute W/chest  08/10/2015  CLINICAL DATA:  Upper abdominal pain for 2 days. Nausea and vomiting. EXAM: DG ABDOMEN ACUTE W/ 1V CHEST COMPARISON:  CT 08/08/2015 FINDINGS: The cardiomediastinal contours are normal. The lungs are clear. There is no free intra-abdominal air. Mild gaseous distention of small and large bowel loops in the central abdomen without obstruction. Enteric contrast from prior CT within the colon. Small volume of stool throughout the colon. Excreted intravenous contrast in the bladder. No radiopaque calculi. No acute osseous abnormalities are seen. IMPRESSION: Mild gaseous distention of small and large bowel loops in the central abdomen, may be ileus. Patient with laboratory findings compatible with pancreatitis, ileus with a common finding with pancreatitis. No obstruction, enteric contrast from CT 2 days prior within the colon. Electronically Signed   By: Rubye Oaks M.D.   On: 08/10/2015 06:27        Scheduled Meds: . enoxaparin (LOVENOX) injection  40 mg Subcutaneous Q24H  . folic acid  1 mg Oral Daily  . LORazepam  0-4 mg Intravenous Q6H   Followed by  . [START ON 08/12/2015] LORazepam  0-4 mg Intravenous Q12H  . multivitamin with minerals  1 tablet Oral Daily  . pantoprazole (PROTONIX) IV  40 mg Intravenous Q24H  . thiamine  100 mg Oral Daily   Or  . thiamine  100 mg Intravenous Daily   Continuous Infusions: . sodium chloride 125 mL/hr at 08/10/15 1403     LOS: 0 days    Time spent: 40 minutes.    [facility], MD [facility] Pager [phone]  If 7PM-7AM, please contact night-coverage [email or web address] Password [facility] 08/10/2015, 2:58 PM

## 2015-08-10 NOTE — Plan of Care (Signed)
Problem: Nutritional: Goal: Ability to achieve adequate nutritional intake will improve Outcome: Not Progressing Currently NPO.  Problem: Pain Managment: Goal: General experience of comfort will improve Outcome: Not Progressing Receiving prn pain medications q2h.   Problem: Tissue Perfusion: Goal: Risk factors for ineffective tissue perfusion will decrease Outcome: Completed/Met Date Met:  08/10/15 Receiving SQ Lovenox and ambulating in hallways.  Problem: Activity: Goal: Risk for activity intolerance will decrease Outcome: Completed/Met Date Met:  08/10/15 Ambulating in hallways independently.  Problem: Nutrition: Goal: Adequate nutrition will be maintained Outcome: Not Progressing Currently NPO.

## 2015-08-10 NOTE — Consult Note (Signed)
Referring Provider: Dr. Debby Bud Primary Care Physician:  Elvina Sidle, MD Primary Gastroenterologist:  None (unassigned)  Reason for Consultation:  Epigastric pain  HPI: Kerry Fernandez is a 30 y.o. male admitted through the emergency room this morning, after couple failed attempts of outpatient management through the emergency room over the past several days. He developed epigastric pain, without radiation to the back, but with nausea and vomiting 2 days ago. In the emergency room, the patient had a CT that showed a normal-appearing pancreas, and liver chemistries were normal, an ultrasound was negative for gallstones. However, his lipase has been running mildly elevated, initially 183 2 days ago, then 105 yesterday, and 80 today.  The patient does not note any dramatic improvement in his abdominal pain over the past 48 hours. He could not keep pain medicine down as an outpatient because of nausea and vomiting.  He does drink heavily, and thinks his alcohol consumption has increased over the past week. He indicates he typically has 3-5 drinks each evening, "sometimes more." He has been doing this for a number of years. However, he has never previously had alcohol related complications such as pancreatitis.     Past Medical History  Diagnosis Date  . Pancreatitis, alcoholic, acute 08/08/2015  . GERD (gastroesophageal reflux disease)     History reviewed. No pertinent past surgical history.  Prior to Admission medications   Medication Sig Start Date End Date Taking? Authorizing Provider  famotidine (PEPCID) 20 MG tablet Take 1 tablet (20 mg total) by mouth 2 (two) times daily. 08/09/15  Yes Pricilla Loveless, MD  ibuprofen (ADVIL,MOTRIN) 200 MG tablet Take 600 mg by mouth every 6 (six) hours as needed (pain).   Yes Historical Provider, MD  ondansetron (ZOFRAN) 4 MG tablet Take 1 tablet (4 mg total) by mouth every 6 (six) hours. Patient taking differently: Take 4 mg by mouth every 6  (six) hours as needed for nausea or vomiting.  08/08/15  Yes Waylan Boga Law, PA-C  oxyCODONE-acetaminophen (PERCOCET/ROXICET) 5-325 MG tablet Take 1-2 tablets by mouth every 4 (four) hours as needed for severe pain. 08/08/15  Yes Alexandra M Law, PA-C  Tetrahydrozoline HCl (VISINE OP) Place 1 drop into both eyes daily as needed (dry eyes).   Yes Historical Provider, MD  Ascorbic Acid (VITAMIN C) 1000 MG tablet Take 2,000 mg by mouth 4 (four) times daily as needed (immune system boost).    Historical Provider, MD  pantoprazole (PROTONIX) 40 MG tablet Take 1 tablet (40 mg total) by mouth daily. 08/09/15   Pricilla Loveless, MD    Current Facility-Administered Medications  Medication Dose Route Frequency Provider Last Rate Last Dose  . 0.9 %  sodium chloride infusion   Intravenous STAT Paula Libra, MD 125 mL/hr at 08/10/15 0122    . 0.9 %  sodium chloride infusion   Intravenous Continuous Eduard Clos, MD 125 mL/hr at 08/10/15 0540    . acetaminophen (TYLENOL) tablet 650 mg  650 mg Oral Q6H PRN Eduard Clos, MD       Or  . acetaminophen (TYLENOL) suppository 650 mg  650 mg Rectal Q6H PRN Eduard Clos, MD      . enoxaparin (LOVENOX) injection 40 mg  40 mg Subcutaneous Q24H Eduard Clos, MD      . fentaNYL (SUBLIMAZE) injection 50 mcg  50 mcg Intravenous Q2H PRN Eduard Clos, MD   50 mcg at 08/10/15 1228  . folic acid (FOLVITE) tablet 1 mg  1 mg  Oral Daily Eduard ClosArshad N Kakrakandy, MD   1 mg at 08/10/15 1006  . LORazepam (ATIVAN) injection 0-4 mg  0-4 mg Intravenous Q6H Eduard ClosArshad N Kakrakandy, MD   1 mg at 08/10/15 1246   Followed by  . [START ON 08/12/2015] LORazepam (ATIVAN) injection 0-4 mg  0-4 mg Intravenous Q12H Eduard ClosArshad N Kakrakandy, MD      . LORazepam (ATIVAN) tablet 1 mg  1 mg Oral Q6H PRN Eduard ClosArshad N Kakrakandy, MD       Or  . LORazepam (ATIVAN) injection 1 mg  1 mg Intravenous Q6H PRN Eduard ClosArshad N Kakrakandy, MD   0.5 mg at 08/10/15 0656  . multivitamin with minerals tablet  1 tablet  1 tablet Oral Daily Eduard ClosArshad N Kakrakandy, MD   1 tablet at 08/10/15 1006  . ondansetron (ZOFRAN) tablet 4 mg  4 mg Oral Q6H PRN Eduard ClosArshad N Kakrakandy, MD       Or  . ondansetron [facility](ZOFRAN) injection 4 mg  4 mg Intravenous Q6H PRN Eduard ClosArshad N Kakrakandy, MD      . pantoprazole (PROTONIX) injection 40 mg  40 mg Intravenous Q24H Eduard ClosArshad N Kakrakandy, MD   40 mg at 08/10/15 1003  . thiamine (VITAMIN B-1) tablet 100 mg  100 mg Oral Daily Eduard ClosArshad N Kakrakandy, MD       Or  . thiamine (B-1) injection 100 mg  100 mg Intravenous Daily Eduard ClosArshad N Kakrakandy, MD   100 mg at 08/10/15 1001    Allergies as of 08/09/2015 - Review Complete 08/09/2015  Allergen Reaction Noted  . Penicillins Other (See Comments) 08/08/2015    Family History  Problem Relation Age of Onset  . Cancer Paternal Grandmother     Social History   Social History  . Marital Status: Married    Spouse Name: N/A  . Number of Children: N/A  . Years of Education: N/A   Occupational History  . Not on file.   Social History Main Topics  . Smoking status: Never Smoker   . Smokeless tobacco: Never Used  . Alcohol Use: 0.0 oz/week    0 Standard drinks or equivalent per week  . Drug Use: No  . Sexual Activity: Not on file   Other Topics Concern  . Not on file   Social History Narrative    Review of Systems: Positive for reflux symptomatology  Physical Exam: Vital signs in last 24 hours: Temp:  [98.2 F (36.8 C)-99.6 F (37.6 C)] 99.3 F (37.4 C) (05/22 0908) Pulse Rate:  [85-131] 98 (05/22 0908) Resp:  [17-20] 18 (05/22 0908) BP: (102-146)/(73-103) 129/73 mmHg (05/22 0908) SpO2:  [94 %-100 %] 98 % (05/22 0908) Weight:  [81.647 kg (180 lb)-85.73 kg (189 lb)] 85.73 kg (189 lb) (05/22 0322)   General:   Alert,  Well-developed, well-nourished, pleasant and cooperative in NAD Head:  Normocephalic and atraumatic. Eyes:  Sclera clear, no icterus.   Conjunctiva pink. Mouth:   No ulcerations or lesions.  Oropharynx pink  & moist. Neck:   No masses or thyromegaly. Lungs:  Clear throughout to auscultation.   No wheezes, crackles, or rhonchi. No evident respiratory distress. Heart:   Regular rate and rhythm; no murmurs, clicks, rubs,  or gallops. Abdomen:  Slightly adipose, active normal bowel sounds, no hepatosplenomegaly by exam or scratch test, mild to moderate epigastric tenderness and guarding. Rectal:  Not performed   Msk:   Symmetrical without gross deformities. Pulses:  Normal radial pulse is noted. Extremities:   Without clubbing, cyanosis, or edema.  Neurologic:  Alert and coherent;  grossly normal neurologically. Skin:  Intact without significant lesions or rashes. Cervical Nodes:  No significant cervical adenopathy. Psych:   Alert and cooperative. Normal mood and affect.  Intake/Output from previous day: 05/21 0701 - 05/22 0700 In: 2000 [IV Piggyback:2000] Out: 450 [Urine:450] Intake/Output this shift:    Lab Results:  Recent Labs  08/09/15 1344 08/09/15 2320 08/10/15 0529  WBC 15.5* 20.7* 18.9*  HGB 16.0 15.4 13.6  HCT 46.6 43.3 41.8  PLT 273 260 244   BMET  Recent Labs  08/09/15 1344 08/09/15 2320 08/10/15 0529  NA 137 134* 135  K 3.7 4.2 4.1  CL 101 103 102  CO2 26 22 25   GLUCOSE 115* 124* 120*  BUN 8 12 9   CREATININE 0.98 0.83 1.04  CALCIUM 9.7 8.8* 8.4*   LFT  Recent Labs  08/10/15 0529  PROT 6.0*  ALBUMIN 3.5  AST 23  ALT 31  ALKPHOS 36*  BILITOT 1.3*  BILIDIR 0.3  IBILI 1.0*   PT/INR No results for input(s): LABPROT, INR in the last 72 hours.  Studies/Results: US Abdomen Complete  08/10/2015  CLINICAL DATA:  Epigastric pain.  History of pancreatitis. EXAM: ABDOMEN ULTRASOUND COMPLETE COMPARISON:  CT abdomen and pelvis 08/08/2015. FINDINGS: Gallbladder: No gallstones or wall thickening visualized. No sonographic Murphy sign noted by sonographer. Common bile duct: Diameter: 6.2 mm, upper limits normal. Liver: No focal lesion identified. Within normal  limits in parenchymal echogenicity. IVC: No abnormality visualized. Pancreas: Obscured by gas. Spleen: Size and appearance within normal limits. Right Kidney: Length: 12.5 cm. Echogenicity within normal limits. No mass or hydronephrosis visualized. Left Kidney: Length: 12.0 cm. Echogenicity within normal limits. No mass or hydronephrosis visualized. Abdominal aorta: 2.3 cm. Other findings: None. IMPRESSION: Unremarkable abdominal ultrasound. Electronically Signed   By: Elsie Stain M.D.   On: 08/10/2015 07:35   Ct Abdomen Pelvis W Contrast  08/08/2015  CLINICAL DATA:  30 year old presenting with acute onset of severe right lower quadrant abdominal pain associated with nausea. Leukocytosis with [place] blood count of [address]. EXAM: CT ABDOMEN AND PELVIS WITH CONTRAST TECHNIQUE: Multidetector CT imaging of the abdomen and pelvis was performed using the standard protocol following bolus administration of intravenous contrast. CONTRAST:  ISOVUE-300 IOPAMIDOL 61% IV. Oral contrast was also administered. COMPARISON:  None. FINDINGS: Lower chest:  Heart size normal.  Visualized lung bases clear. Hepatobiliary: Liver normal in size and appearance. Gallbladder mildly distended without calcified gallstones. No pericholecystic edema/inflammation. No biliary ductal dilation. Pancreas: Normal in appearance without evidence of mass, ductal dilation, or inflammation. Spleen: Normal in size and appearance. Adrenals/Urinary Tract: Normal appearing adrenal glands. Kidneys normal in size and appearance without focal parenchymal abnormality. No evidence of urinary tract calculi or obstruction. Normal-appearing urinary bladder. Stomach/Bowel: Stomach normal in appearance for the degree of distention. Normal-appearing small bowel. Expected stool burden throughout normal appearing, relatively decompressed colon. Normal appendix in the right upper pelvis. Vascular/Lymphatic: No visible aortoiliofemoral atherosclerosis. Widely patent  visceral arteries. Normal-appearing portal venous and systemic venous systems. No pathologic lymphadenopathy. Reproductive: Prostate gland and seminal vesicles normal in size and appearance for age. Other: None. Musculoskeletal: Regional skeleton intact without acute or significant osseous abnormality. IMPRESSION: 1. Mildly distended gallbladder without evidence of cholelithiasis or acute cholecystitis. 2. Otherwise normal examination. Electronically Signed   By: Hulan Saas M.D.   On: 08/08/2015 23:35   Dg Abd Acute W/chest  08/10/2015  CLINICAL DATA:  Upper abdominal pain for 2 days. Nausea and  vomiting. EXAM: DG ABDOMEN ACUTE W/ 1V CHEST COMPARISON:  CT 08/08/2015 FINDINGS: The cardiomediastinal contours are normal. The lungs are clear. There is no free intra-abdominal air. Mild gaseous distention of small and large bowel loops in the central abdomen without obstruction. Enteric contrast from prior CT within the colon. Small volume of stool throughout the colon. Excreted intravenous contrast in the bladder. No radiopaque calculi. No acute osseous abnormalities are seen. IMPRESSION: Mild gaseous distention of small and large bowel loops in the central abdomen, may be ileus. Patient with laboratory findings compatible with pancreatitis, ileus with a common finding with pancreatitis. No obstruction, enteric contrast from CT 2 days prior within the colon. Electronically Signed   By: Rubye Oaks M.D.   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 The clinical symptomatology is characteristic and the gradually declining level of lipase is suggestive.  Plan: Continue supportive care with IV fluids and pain medications. Keep nothing by mouth until lipase level is normal, at which time clear liquids might be introduced.  I discussed at some length with  the patient the importance of alcohol avoidance, and went into some detail regarding the pathophysiology of alcohol-related pancreatitis.  We will continue to follow this patient with you. At some point, outpatient endoscopic ultrasound after there has been complete resolution of the inflammation associated with this episode, might be useful to help determine if there changes of underlying chronic pancreatitis, as I suspect is probably the case.   LOS: 0 days   Weaver Tweed V  08/10/2015, 12:52 PM   Pager [phone] If no answer or after 5 PM call [phone]

## 2015-08-11 DIAGNOSIS — F1020 Alcohol dependence, uncomplicated: Secondary | ICD-10-CM

## 2015-08-11 DIAGNOSIS — D72829 Elevated white blood cell count, unspecified: Secondary | ICD-10-CM

## 2015-08-11 LAB — COMPREHENSIVE METABOLIC PANEL
ALBUMIN: 3.2 g/dL — AB (ref 3.5–5.0)
ALK PHOS: 42 U/L (ref 38–126)
ALT: 27 U/L (ref 17–63)
AST: 23 U/L (ref 15–41)
Anion gap: 9 (ref 5–15)
BILIRUBIN TOTAL: 1.3 mg/dL — AB (ref 0.3–1.2)
BUN: 8 mg/dL (ref 6–20)
CO2: 24 mmol/L (ref 22–32)
CREATININE: 1.02 mg/dL (ref 0.61–1.24)
Calcium: 8.8 mg/dL — ABNORMAL LOW (ref 8.9–10.3)
Chloride: 102 mmol/L (ref 101–111)
GFR calc Af Amer: >60 mL/min (ref >60)
GLUCOSE: 93 mg/dL (ref 65–99)
Potassium: 4.5 mmol/L (ref 3.5–5.1)
Sodium: 135 mmol/L (ref 135–145)
TOTAL PROTEIN: 6.2 g/dL — AB (ref 6.5–8.1)

## 2015-08-11 LAB — CBC
HEMATOCRIT: 41.3 % (ref 39.0–52.0)
Hemoglobin: 13.3 g/dL (ref 13.0–17.0)
MCH: 29.8 pg (ref 26.0–34.0)
MCHC: 32.2 g/dL (ref 30.0–36.0)
MCV: 92.6 fL (ref 78.0–100.0)
Platelets: 221 10*3/uL (ref 150–400)
RBC: 4.46 MIL/uL (ref 4.22–5.81)
RDW: 13 % (ref 11.5–15.5)
WBC: 16.4 10*3/uL — AB (ref 4.0–10.5)

## 2015-08-11 LAB — LIPASE, BLOOD: LIPASE: 26 U/L (ref 11–51)

## 2015-08-11 MED ORDER — SODIUM CHLORIDE 0.9 % IV SOLN
INTRAVENOUS | Status: AC
  Administered 2015-08-11: 1000 mL via INTRAVENOUS
  Administered 2015-08-11: 13:00:00 via INTRAVENOUS

## 2015-08-11 MED ORDER — FENTANYL CITRATE (PF) 100 MCG/2ML IJ SOLN
100.0000 ug | Freq: Once | INTRAMUSCULAR | Status: AC
  Administered 2015-08-11: 100 ug via INTRAVENOUS
  Filled 2015-08-11: qty 2

## 2015-08-11 NOTE — Plan of Care (Signed)
Problem: Nutritional: Goal: Ability to achieve adequate nutritional intake will improve Outcome: Progressing Tolerating clear liquids today.  Problem: Pain Managment: Goal: General experience of comfort will improve Outcome: Progressing Still requiring q2h prn pain medications.  Problem: Nutrition: Goal: Adequate nutrition will be maintained Outcome: Progressing Tolerating clear liquids today.

## 2015-08-11 NOTE — Progress Notes (Signed)
PROGRESS NOTE  Albin FischerMatthew A Sean  ZOX:[id number] DOB: Nov 24, 1985  DOA: 08/09/2015 PCP: Elvina SidleLAUENSTEIN,KURT, MD   Brief Narrative:  30 year old male with history of alcohol dependence, GERD, presented to the [facility] [place] for the third time in 48 hours for complaints of abdominal pain and nonbloody emesis. He last consumed alcohol on 08/07/17 and started having abdominal pain the same evening. CT abdomen 5/20: Mildly distended gallbladder without evidence of cholelithiasis or acute cholecystitis otherwise normal exam (pancreas reported as normal), initial lipase 183 and unremarkable LFTs. [facility]. [facility] was consulted.   Assessment & Plan:   Principal Problem:   Acute alcoholic pancreatitis Active Problems:   Acute pancreatitis   Acute alcoholic pancreatitis - CT abdomen: No acute findings and pancreas reported as normal. Abdominal ultrasound: Unremarkable. Initial lipase: 183. LFTs unremarkable. - Since imaging was unremarkable and lipase was only mildly elevated, there was concern that his presentation was related to some other etiology i.e. GERD with acute esophagitis, gastritis, PUD etc. Hence [facility] was consulted and assessed to have acute alcoholic pancreatitis.  - Treated with supportive care with nothing by mouth/bowel rest, IV fluids and pain medications. - Triglycerides: 50. Lactate normal. - KUB suggestive of ileus - Improving. Advance to clear liquids. Lipase normalized.  Alcohol dependence - continue CIWA protocol including scheduled Ativan. At risk for withdrawal. No overt withdrawal seen.  Fever - Possibly from acute pancreatitis. Chest x-ray 5/22 and urine microscopy not indicative of infection. Blood cultures 2: Negative to date. Patient does not appear septic or toxic. Treat with antipyretics and follow. Hold antimicrobials for now.  Leukocytosis - Likely secondary to pancreatitis. Improving.   DVT  prophylaxis: Lovenox Code Status: Full Family Communication: Discussed extensively with patient's spouse at bedside. Disposition Plan: DC home when medically stable, possibly in the next 2-3 days.   [facility]ants:   [facility]  Procedures:   None  Antimicrobials:   None    Subjective: Intermittent fevers. Abdominal pain better. No nausea, vomiting. Asking for food this morning. No cough, dyspnea, chest pain, dysuria or urinary frequency.  Objective:  Filed Vitals:   08/10/15 2030 08/10/15 2033 08/11/15 0432 08/11/15 0831  BP: 132/87  111/63 105/57  Pulse: 103  89 91  Temp: 102 F (38.9 C) 101.7 F (38.7 C) 98.2 F (36.8 C) 101.2 F (38.4 C)  TempSrc: Oral Oral Oral Oral  Resp: 16  15 16   Height:      Weight:  83.507 kg (184 lb 1.6 oz)    SpO2: 98%  97% 96%    Intake/Output Summary (Last 24 hours) at 08/11/15 1513 Last data filed at 08/11/15 1446  Gross per 24 hour  Intake    240 ml  Output   2450 ml  Net  -2210 ml   Filed Weights   08/09/15 2048 08/10/15 0322 08/10/15 2033  Weight: 81.647 kg (180 lb) 85.73 kg (189 lb) 83.507 kg (184 lb 1.6 oz)    Examination:  General exam: Pleasant young male lying comfortably propped up in bed. Respiratory system: Clear to auscultation. Respiratory effort normal. Cardiovascular system: S1 & S2 heard, RRR. No JVD, murmurs, rubs, gallops or clicks. No pedal edema.Telemetry: SR 90s-ST in the 100s. Gastrointestinal system: Abdomen is mildly distended, soft and mild epigastric tenderness without peritoneal signs. No organomegaly or masses felt. Normal bowel sounds heard. Central nervous system: Alert and oriented. No focal neurological deficits. Extremities: Symmetric 5 x 5 power. Skin: No rashes,  lesions or ulcers Psychiatry: Judgement and insight appear normal. Mood & affect appropriate.     Data Reviewed: I have personally reviewed following labs and imaging studies  CBC:  Recent Labs Lab 08/08/15 2141 08/09/15 1344  08/09/15 2320 08/10/15 0529 08/11/15 0512  WBC 14.1* 15.5* 20.7* 18.9* 16.4*  NEUTROABS  --   --  18.7* 16.3*  --   HGB 16.1 16.0 15.4 13.6 13.3  HCT 45.0 46.6 43.3 41.8 41.3  MCV 88.1 87.9 88.5 91.7 92.6  PLT 299 273 260 244 221   Basic Metabolic Panel:  Recent Labs Lab 08/08/15 2141 08/09/15 1344 08/09/15 2320 08/10/15 0529 08/11/15 0512  NA 137 137 134* 135 135  K 3.3* 3.7 4.2 4.1 4.5  CL 102 101 103 102 102  CO2 22 26 22 25 24   GLUCOSE 136* 115* 124* 120* 93  BUN 15 8 12 9 8   CREATININE 1.13 0.98 0.83 1.04 1.02  CALCIUM 9.1 9.7 8.8* 8.4* 8.8*   GFR: Estimated Creatinine Clearance: 112.8 mL/min (by C-G formula based on Cr of 1.02). Liver Function Tests:  Recent Labs Lab 08/08/15 2141 08/09/15 1344 08/09/15 2320 08/10/15 0529 08/11/15 0512  AST 43* 29 28 23 23   ALT 48 42 35 31 27  ALKPHOS 45 48 41 36* 42  BILITOT 0.6 1.2 1.5* 1.3* 1.3*  PROT 7.6 7.0 7.0 6.0* 6.2*  ALBUMIN 4.7 4.2 4.2 3.5 3.2*    Recent Labs Lab 08/08/15 2141 08/09/15 1344 08/09/15 2320 08/10/15 0529 08/11/15 0512  LIPASE 183* 105* 105* 80* 26   No results for input(s): AMMONIA in the last 168 hours. Coagulation Profile: No results for input(s): INR, PROTIME in the last 168 hours. Cardiac Enzymes:  Recent Labs Lab 08/10/15 0529  TROPONINI <0.03   BNP (last 3 results) No results for input(s): PROBNP in the last 8760 hours. HbA1C: No results for input(s): HGBA1C in the last 72 hours. CBG:  Recent Labs Lab 08/10/15 1301  GLUCAP 103*   Lipid Profile:  Recent Labs  08/10/15 0524  CHOL 144  HDL 46  LDLCALC 88  TRIG 50  CHOLHDL 3.1   Thyroid Function Tests: No results for input(s): TSH, T4TOTAL, FREET4, T3FREE, THYROIDAB in the last 72 hours. Anemia Panel: No results for input(s): VITAMINB12, FOLATE, FERRITIN, TIBC, IRON, RETICCTPCT in the last 72 hours.  Sepsis Labs:  Recent Labs Lab 08/10/15 0524  LATICACIDVEN 0.9    Recent Results (from the past 240  hour(s))  Culture, blood (routine x 2)     Status: None (Preliminary result)   Collection Time: 08/10/15  7:31 AM  Result Value Ref Range Status   Specimen Description BLOOD LEFT ARM  Final   Special Requests BOTTLES DRAWN AEROBIC AND ANAEROBIC 10CC   Final   Culture NO GROWTH 1 DAY  Final   Report Status PENDING  Incomplete  Culture, blood (routine x 2)     Status: None (Preliminary result)   Collection Time: 08/10/15  7:38 AM  Result Value Ref Range Status   Specimen Description BLOOD LEFT HAND  Final   Special Requests BOTTLES DRAWN AEROBIC AND ANAEROBIC 5CC   Final   Culture NO GROWTH 1 DAY  Final   Report Status PENDING  Incomplete         Radiology Studies: US Abdomen Complete  08/10/2015  CLINICAL DATA:  Epigastric pain.  History of pancreatitis. EXAM: ABDOMEN ULTRASOUND COMPLETE COMPARISON:  CT abdomen and pelvis 08/08/2015. FINDINGS: Gallbladder: No gallstones or wall thickening visualized. No  sonographic Eulah Pont sign noted by sonographer. Common bile duct: Diameter: 6.2 mm, upper limits normal. Liver: No focal lesion identified. Within normal limits in parenchymal echogenicity. IVC: No abnormality visualized. Pancreas: Obscured by gas. Spleen: Size and appearance within normal limits. Right Kidney: Length: 12.5 cm. Echogenicity within normal limits. No mass or hydronephrosis visualized. Left Kidney: Length: 12.0 cm. Echogenicity within normal limits. No mass or hydronephrosis visualized. Abdominal aorta: 2.3 cm. Other findings: None. IMPRESSION: Unremarkable abdominal ultrasound. Electronically Signed   By: Elsie Stain M.D.   On: 08/10/2015 07:35   Dg Abd Acute W/chest  08/10/2015  CLINICAL DATA:  Upper abdominal pain for 2 days. Nausea and vomiting. EXAM: DG ABDOMEN ACUTE W/ 1V CHEST COMPARISON:  CT 08/08/2015 FINDINGS: The cardiomediastinal contours are normal. The lungs are clear. There is no free intra-abdominal air. Mild gaseous distention of small and large bowel loops in  the central abdomen without obstruction. Enteric contrast from prior CT within the colon. Small volume of stool throughout the colon. Excreted intravenous contrast in the bladder. No radiopaque calculi. No acute osseous abnormalities are seen. IMPRESSION: Mild gaseous distention of small and large bowel loops in the central abdomen, may be ileus. Patient with laboratory findings compatible with pancreatitis, ileus with a common finding with pancreatitis. No obstruction, enteric contrast from CT 2 days prior within the colon. Electronically Signed   By: Rubye Oaks M.D.   On: 08/10/2015 06:27        Scheduled Meds: . enoxaparin (LOVENOX) injection  40 mg Subcutaneous Q24H  . folic acid  1 mg Oral Daily  . LORazepam  0-4 mg Intravenous Q6H   Followed by  . [START ON 08/12/2015] LORazepam  0-4 mg Intravenous Q12H  . multivitamin with minerals  1 tablet Oral Daily  . pantoprazole (PROTONIX) IV  40 mg Intravenous Q24H  . thiamine  100 mg Oral Daily   Or  . thiamine  100 mg Intravenous Daily   Continuous Infusions: . sodium chloride 125 mL/hr at 08/11/15 1249     LOS: 1 day    Time spent: 30 minutes.    [facility], MD [facility] Pager [phone]  If 7PM-7AM, please contact night-coverage [email or web address] Password [facility] 08/11/2015, 3:13 PM

## 2015-08-11 NOTE — Progress Notes (Signed)
Utilization review complete. Kaitlan Bin RN CCM Case Mgmt phone [phone] 

## 2015-08-11 NOTE — Progress Notes (Signed)
Somewhat less pain. Lipase normal. Abd mild tend. Good bs's.   Will advance to clear liquids.  Florencia Reasonsobert V. Carissa Musick, M.D. Pager [phone] If no answer or after 5 PM call [phone]

## 2015-08-11 NOTE — Progress Notes (Signed)
Late Entry  Patient called RN into his room to bring attention to a sore spot on his right lateral/posterior elbow this afternoon. It's a very small circular area, slightly larger than a pencil tip. The center is black with a white border and surrounding skin is red. Patient states he had moved and bumped his elbow against the side rail and then noticed it was sore. At shift change, during bedside report, it was noted that the spot is now white with the surrounding skin being reddened. Almost looks pus filled in appearance. Night shift RN aware. Will continue to monitor.  Leanna BattlesEckelmann, [name] Eileen, RN.

## 2015-08-12 DIAGNOSIS — K852 Alcohol induced acute pancreatitis without necrosis or infection: Principal | ICD-10-CM

## 2015-08-12 DIAGNOSIS — R509 Fever, unspecified: Secondary | ICD-10-CM

## 2015-08-12 LAB — BASIC METABOLIC PANEL
Anion gap: 7 (ref 5–15)
BUN: 6 mg/dL (ref 6–20)
CHLORIDE: 101 mmol/L (ref 101–111)
CO2: 27 mmol/L (ref 22–32)
Calcium: 8.7 mg/dL — ABNORMAL LOW (ref 8.9–10.3)
Creatinine, Ser: 0.87 mg/dL (ref 0.61–1.24)
GFR calc non Af Amer: >60 mL/min (ref >60)
Glucose, Bld: 96 mg/dL (ref 65–99)
POTASSIUM: 3.9 mmol/L (ref 3.5–5.1)
SODIUM: 135 mmol/L (ref 135–145)

## 2015-08-12 LAB — CBC
HEMATOCRIT: 38.1 % — AB (ref 39.0–52.0)
Hemoglobin: 12.8 g/dL — ABNORMAL LOW (ref 13.0–17.0)
MCH: 30 pg (ref 26.0–34.0)
MCHC: 33.6 g/dL (ref 30.0–36.0)
MCV: 89.4 fL (ref 78.0–100.0)
Platelets: 251 10*3/uL (ref 150–400)
RBC: 4.26 MIL/uL (ref 4.22–5.81)
RDW: 12.7 % (ref 11.5–15.5)
WBC: 15.1 10*3/uL — AB (ref 4.0–10.5)

## 2015-08-12 MED ORDER — SODIUM CHLORIDE 0.9 % IV SOLN
INTRAVENOUS | Status: DC
  Administered 2015-08-12 – 2015-08-14 (×5): via INTRAVENOUS
  Filled 2015-08-12: qty 1000

## 2015-08-12 MED ORDER — FENTANYL CITRATE (PF) 100 MCG/2ML IJ SOLN
50.0000 ug | INTRAMUSCULAR | Status: DC | PRN
  Administered 2015-08-12 – 2015-08-14 (×13): 50 ug via INTRAVENOUS
  Filled 2015-08-12 (×13): qty 2

## 2015-08-12 NOTE — Progress Notes (Signed)
Patient ID: Kerry Fernandez, male   DOB: 1985/11/29, 30 y.o.   MRN: [id number] [facility]  Kerry Fernandez 30 y.o. 1985/11/29   Subjective: Continues to have upper abdominal pain (reports pain is greatest in RUQ). Pain 6/10 right now. Tolerating clears and denies worsened abdominal pain on clears. No BMs but reports flatus. Ambulating more.  Objective: Vital signs: Filed Vitals:   08/11/15 2004 08/12/15 0537  BP: 128/79 129/69  Pulse: 85 83  Temp: 98.9 F (37.2 C) 100.3 F (37.9 C)  Resp: 17 16    Physical Exam: Gen: alert, no acute distress  HEENT: anicteric sclera CV: RRR Chest: CTA B Abd: RUQ tenderness with guarding, epigastric tenderness with minimal guarding, minimal LUQ tenderness with minimal guarding, soft, nondistended, +BS Ext: no edema  Lab Results:  Recent Labs  08/10/15 0529 08/11/15 0512  NA 135 135  K 4.1 4.5  CL 102 102  CO2 25 24  GLUCOSE 120* 93  BUN 9 8  CREATININE 1.04 1.02  CALCIUM 8.4* 8.8*    Recent Labs  08/10/15 0529 08/11/15 0512  AST 23 23  ALT 31 27  ALKPHOS 36* 42  BILITOT 1.3* 1.3*  PROT 6.0* 6.2*  ALBUMIN 3.5 3.2*    Recent Labs  08/09/15 2320 08/10/15 0529 08/11/15 0512  WBC 20.7* 18.9* 16.4*  NEUTROABS 18.7* 16.3*  --   HGB 15.4 13.6 13.3  HCT 43.3 41.8 41.3  MCV 88.5 91.7 92.6  PLT 260 244 221      Assessment/Plan: Resolving pancreatitis likely due to alcohol - due to ongoing pain would continue IVFs and clear liquid diet and not advance diet today. Consider slowly advancing diet tomorrow. Continue supportive care. LFTs and lipase normal yesterday. Continue to closely follow electrolytes so will check BMET today and recheck CBC.  Sankalp Ferrell C. 08/12/2015, 9:59 AM  Pager [phone]  If no answer or after 5 PM call [phone]

## 2015-08-12 NOTE — Progress Notes (Signed)
PROGRESS NOTE  Kerry Fernandez NWG:[id number] DOB: [birth year] DOA: 08/09/2015 PCP: Elvina Sidle, MD Brief Narrative:  30 year old male with history of alcohol dependence, GERD, presented to the [facility] [place] for the third time in 48 hours for complaints of abdominal pain and nonbloody emesis. He last consumed alcohol on 08/07/17 and started having abdominal pain the same evening. CT abdomen 5/20: Mildly distended gallbladder without evidence of cholelithiasis or acute cholecystitis otherwise normal exam (pancreas reported as normal), initial lipase 183 and unremarkable LFTs. Patient transferred to [facility] for management of acute pancreatitis. [facility] was consulted.   Assessment & Plan: Acute alcoholic pancreatitis - CT abdomen: No acute findings and pancreas reported as normal.  -Abdominal ultrasound: Unremarkable.  -Initial lipase: 183. LFTs unremarkable. - Since imaging was unremarkable and lipase was only mildly elevated, there was concern that his presentation was related to some other etiology i.e. GERD with acute esophagitis, gastritis, PUD etc. Hence [facility] was consulted and assessed to have acute alcoholic pancreatitis.  - Treated with supportive care with nothing by mouth/bowel rest, IV fluids and pain medications. - Triglycerides: 50. Lactate normal. - KUB suggestive of ileus--passing flatus - Improving. Continue clear liquids. Lipase normalized. -decrease fentanyl frequency to q 3 hours  Alcohol dependence - continue CIWA protocol including scheduled Ativan.  -No overt withdrawal seen.  Fever - Possibly from acute pancreatitis vs R-arm furuncle -Chest x-ray 5/22 and urine microscopy not indicative of infection. Blood cultures 2: Negative to date.  Patient does not appear septic or toxic. Treat with antipyretics and follow. Hold antimicrobials for now.  R-arm furuncle -monitor clinically off abx -start abx if worsen or persistent fever  Leukocytosis -  Likely secondary to pancreatitis. Improving.    Disposition Plan:   Home likely 5/26 Family Communication:  No Family at beside  Consultants:  [facility]  Code Status:  FULL   Subjective: Patient still complains of abdominal pain but no worse with cold liquids. No emesis. Denies any fevers, chills, chest pain, hours breath, diarrhea, hematochezia. No bowel movement but passing flatus  Objective: Filed Vitals:   08/11/15 2004 08/12/15 0537 08/12/15 0826 08/12/15 1000  BP: 128/79 129/69  132/81  Pulse: 85 83  87  Temp: 98.9 F (37.2 C) 100.3 F (37.9 C) 100.1 F (37.8 C)   TempSrc: Oral Oral Oral   Resp: 17 16  18   Height:      Weight: 85.8 kg (189 lb 2.5 oz)     SpO2: 96% 98%  98%    Intake/Output Summary (Last 24 hours) at 08/12/15 1737 Last data filed at 08/12/15 1400  Gross per 24 hour  Intake 3828.33 ml  Output   2100 ml  Net 1728.33 ml   Weight change: 2.293 kg (5 lb 0.9 oz) Exam:   General:  Pt is alert, follows commands appropriately, not in acute distress  HEENT: No icterus, No thrush, No neck mass, [place]/AT  Cardiovascular: RRR, S1/S2, no rubs, no gallops  Respiratory: CTA bilaterally, no wheezing, no crackles, no rhonchi  Abdomen: Soft/+BS, epigastric tender without rebound, non distended, no guarding  Extremities: No edema, No lymphangitis, No petechiae, No rashes, no synovitis   Data Reviewed: I have personally reviewed following labs and imaging studies Basic Metabolic Panel:  Recent Labs Lab 08/09/15 1344 08/09/15 2320 08/10/15 0529 08/11/15 0512 08/12/15 1127  NA 137 134* 135 135 135  K 3.7 4.2 4.1 4.5 3.9  CL 101 103 102  102 101  CO2 GLUCOSE 115* 124* 120* 93 96  BUN CREATININE 0.98 0.83 1.04 1.02 0.87  CALCIUM 9.7 8.8* 8.4* 8.8* 8.7*   Liver Function Tests:  Recent Labs Lab 08/08/15 2141 08/09/15 1344 08/09/15 2320 08/10/15 0529 08/11/15 0512  AST 43* ALT 48 42 35 31 27  ALKPHOS  45 48 41 36* 42  BILITOT 0.6 1.2 1.5* 1.3* 1.3*  PROT 7.6 7.0 7.0 6.0* 6.2*  ALBUMIN 4.7 4.2 4.2 3.5 3.2*    Recent Labs Lab 08/08/15 2141 08/09/15 1344 08/09/15 2320 08/10/15 0529 08/11/15 0512  LIPASE 183* 105* 105* 80* 26   No results for input(s): AMMONIA in the last 168 hours. Coagulation Profile: No results for input(s): INR, PROTIME in the last 168 hours. CBC:  Recent Labs Lab 08/09/15 1344 08/09/15 2320 08/10/15 0529 08/11/15 0512 08/12/15 1127  WBC 15.5* 20.7* 18.9* 16.4* 15.1*  NEUTROABS  --  18.7* 16.3*  --   --   HGB 16.0 15.4 13.6 13.3 12.8*  HCT 46.6 43.3 41.8 41.3 38.1*  MCV 87.9 88.5 91.7 92.6 89.4  PLT 273 260 244 221 251   Cardiac Enzymes:  Recent Labs Lab 08/10/15 0529  TROPONINI <0.03   BNP: Invalid input(s): POCBNP CBG:  Recent Labs Lab 08/10/15 1301  GLUCAP 103*   HbA1C: No results for input(s): HGBA1C in the last 72 hours. Urine analysis:    Component Value Date/Time   COLORURINE YELLOW 08/10/2015 0645   APPEARANCEUR CLEAR 08/10/2015 0645   LABSPEC 1.015 08/10/2015 0645   PHURINE 6.5 08/10/2015 0645   GLUCOSEU NEGATIVE 08/10/2015 0645   HGBUR NEGATIVE 08/10/2015 0645   BILIRUBINUR NEGATIVE 08/10/2015 0645   BILIRUBINUR neg 05/06/2014 1038   KETONESUR 15* 08/10/2015 0645   PROTEINUR NEGATIVE 08/10/2015 0645   PROTEINUR neg 05/06/2014 1038   UROBILINOGEN 0.2 05/06/2014 1038   NITRITE NEGATIVE 08/10/2015 0645   NITRITE neg 05/06/2014 1038   LEUKOCYTESUR NEGATIVE 08/10/2015 0645   Sepsis Labs: (procalcitonin:4,lacticidven:4) ) Recent Results (from the past 240 hour(s))  Culture, blood (routine x 2)     Status: None (Preliminary result)   Collection Time: 08/10/15  7:31 AM  Result Value Ref Range Status   Specimen Description BLOOD LEFT ARM  Final   Special Requests BOTTLES DRAWN AEROBIC AND ANAEROBIC 10CC   Final   Culture NO GROWTH 2 DAYS  Final   Report Status PENDING  Incomplete  Culture, blood (routine  x 2)     Status: None (Preliminary result)   Collection Time: 08/10/15  7:38 AM  Result Value Ref Range Status   Specimen Description BLOOD LEFT HAND  Final   Special Requests BOTTLES DRAWN AEROBIC AND ANAEROBIC 5CC   Final   Culture NO GROWTH 2 DAYS  Final   Report Status PENDING  Incomplete     Scheduled Meds: . enoxaparin (LOVENOX) injection  40 mg Subcutaneous Q24H  . folic acid  1 mg Oral Daily  . LORazepam  0-4 mg Intravenous Q12H  . multivitamin with minerals  1 tablet Oral Daily  . pantoprazole (PROTONIX) IV  40 mg Intravenous Q24H  . thiamine  100 mg Oral Daily   Or  . thiamine  100 mg Intravenous Daily   Continuous Infusions: . sodium chloride 0.9 % 1,000 mL infusion 100 mL/hr at 08/12/15 1116    Procedures/Studies: US Abdomen Complete  08/10/2015  CLINICAL DATA:  Epigastric pain.  History of pancreatitis. EXAM: ABDOMEN ULTRASOUND COMPLETE COMPARISON:  CT abdomen and pelvis 08/08/2015. FINDINGS: Gallbladder: No gallstones or wall thickening visualized. No sonographic Murphy sign noted by sonographer. Common bile duct: Diameter: 6.2 mm, upper limits normal. Liver: No focal lesion identified. Within normal limits in parenchymal echogenicity. IVC: No abnormality visualized. Pancreas: Obscured by gas. Spleen: Size and appearance within normal limits. Right Kidney: Length: 12.5 cm. Echogenicity within normal limits. No mass or hydronephrosis visualized. Left Kidney: Length: 12.0 cm. Echogenicity within normal limits. No mass or hydronephrosis visualized. Abdominal aorta: 2.3 cm. Other findings: None. IMPRESSION: Unremarkable abdominal ultrasound. Electronically Signed   By: Elsie StainJohn T Curnes M.D.   On: 08/10/2015 07:35   Ct Abdomen Pelvis W Contrast  08/08/2015  CLINICAL DATA:  30 year old presenting with acute onset of severe right lower quadrant abdominal pain associated with nausea. Leukocytosis with white blood count of [id number]. EXAM: CT ABDOMEN AND PELVIS WITH CONTRAST TECHNIQUE:  Multidetector CT imaging of the abdomen and pelvis was performed using the standard protocol following bolus administration of intravenous contrast. CONTRAST:  100mL ISOVUE-300 IOPAMIDOL 61% IV. Oral contrast was also administered. COMPARISON:  None. FINDINGS: Lower chest:  Heart size normal.  Visualized lung bases clear. Hepatobiliary: Liver normal in size and appearance. Gallbladder mildly distended without calcified gallstones. No pericholecystic edema/inflammation. No biliary ductal dilation. Pancreas: Normal in appearance without evidence of mass, ductal dilation, or inflammation. Spleen: Normal in size and appearance. Adrenals/Urinary Tract: Normal appearing adrenal glands. Kidneys normal in size and appearance without focal parenchymal abnormality. No evidence of urinary tract calculi or obstruction. Normal-appearing urinary bladder. Stomach/Bowel: Stomach normal in appearance for the degree of distention. Normal-appearing small bowel. Expected stool burden throughout normal appearing, relatively decompressed colon. Normal appendix in the right upper pelvis. Vascular/Lymphatic: No visible aortoiliofemoral atherosclerosis. Widely patent visceral arteries. Normal-appearing portal venous and systemic venous systems. No pathologic lymphadenopathy. Reproductive: Prostate gland and seminal vesicles normal in size and appearance for age. Other: None. Musculoskeletal: Regional skeleton intact without acute or significant osseous abnormality. IMPRESSION: 1. Mildly distended gallbladder without evidence of cholelithiasis or acute cholecystitis. 2. Otherwise normal examination. Electronically Signed   By: Hulan Saashomas  Lawrence M.D.   On: 08/08/2015 23:35   Dg Abd Acute W/chest  08/10/2015  CLINICAL DATA:  Upper abdominal pain for 2 days. Nausea and vomiting. EXAM: DG ABDOMEN ACUTE W/ 1V CHEST COMPARISON:  CT 08/08/2015 FINDINGS: The cardiomediastinal contours are normal. The lungs are clear. There is no free  intra-abdominal air. Mild gaseous distention of small and large bowel loops in the central abdomen without obstruction. Enteric contrast from prior CT within the colon. Small volume of stool throughout the colon. Excreted intravenous contrast in the bladder. No radiopaque calculi. No acute osseous abnormalities are seen. IMPRESSION: Mild gaseous distention of small and large bowel loops in the central abdomen, may be ileus. Patient with laboratory findings compatible with pancreatitis, ileus with a common finding with pancreatitis. No obstruction, enteric contrast from CT 2 days prior within the colon. Electronically Signed   By: Rubye OaksMelanie  Ehinger M.D.   On: 08/10/2015 06:27    Rafel Garde, DO  [facility] Pager [phone]  If 7PM-7AM, please contact night-coverage [email or web address] Password [email or web address] 08/12/2015, 5:37 PM   LOS: 2 days

## 2015-08-13 LAB — COMPREHENSIVE METABOLIC PANEL
ALK PHOS: 53 U/L (ref 38–126)
ALT: 34 U/L (ref 17–63)
ANION GAP: 8 (ref 5–15)
AST: 27 U/L (ref 15–41)
Albumin: 2.9 g/dL — ABNORMAL LOW (ref 3.5–5.0)
BILIRUBIN TOTAL: 1.1 mg/dL (ref 0.3–1.2)
BUN: 6 mg/dL (ref 6–20)
CO2: 24 mmol/L (ref 22–32)
Calcium: 8.8 mg/dL — ABNORMAL LOW (ref 8.9–10.3)
Chloride: 105 mmol/L (ref 101–111)
Creatinine, Ser: 0.85 mg/dL (ref 0.61–1.24)
GLUCOSE: 93 mg/dL (ref 65–99)
POTASSIUM: 3.5 mmol/L (ref 3.5–5.1)
Sodium: 137 mmol/L (ref 135–145)
TOTAL PROTEIN: 6.1 g/dL — AB (ref 6.5–8.1)

## 2015-08-13 LAB — CBC
HEMATOCRIT: 39.4 % (ref 39.0–52.0)
HEMOGLOBIN: 13.1 g/dL (ref 13.0–17.0)
MCH: 30.2 pg (ref 26.0–34.0)
MCHC: 33.2 g/dL (ref 30.0–36.0)
MCV: 90.8 fL (ref 78.0–100.0)
Platelets: 267 10*3/uL (ref 150–400)
RBC: 4.34 MIL/uL (ref 4.22–5.81)
RDW: 12.5 % (ref 11.5–15.5)
WBC: 14 10*3/uL — AB (ref 4.0–10.5)

## 2015-08-13 LAB — SEDIMENTATION RATE: Sed Rate: 74 mm/hr — ABNORMAL HIGH (ref 0–16)

## 2015-08-13 MED ORDER — BISACODYL 10 MG RE SUPP
10.0000 mg | Freq: Once | RECTAL | Status: AC
  Administered 2015-08-13: 10 mg via RECTAL
  Filled 2015-08-13: qty 1

## 2015-08-13 MED ORDER — DOCUSATE SODIUM 100 MG PO CAPS
100.0000 mg | ORAL_CAPSULE | Freq: Two times a day (BID) | ORAL | Status: DC
  Administered 2015-08-13 – 2015-08-15 (×4): 100 mg via ORAL
  Filled 2015-08-13 (×5): qty 1

## 2015-08-13 MED ORDER — DIPHENHYDRAMINE HCL 25 MG PO CAPS
25.0000 mg | ORAL_CAPSULE | Freq: Once | ORAL | Status: AC
  Administered 2015-08-13: 25 mg via ORAL
  Filled 2015-08-13: qty 1

## 2015-08-13 NOTE — Progress Notes (Signed)
GASTROENTEROLOGY PROGRESS NOTE  Problem:   Acute pancreatitis due to alcohol, resolving  Subjective: Abdominal pain better. Pain medication frequency has been reduced from every 2 hours to every 3 hours, without significant pain exacerbation. Transient nausea last night after drinking a lot of water quickly.  Tolerating her liquids well at this time.  Objective: Continues to have low-grade fever (100.7) and mild leukocytosis, improving, currently 14,000.  Currently sitting in bedside chair, eating a clear liquid lunch, no distress. Abdomen essentially nontender.  Lipase not rechecked today.  Assessment: Clinically improved. Evidence of ongoing inflammation (doubt infection) with persistent leukocytosis and low-grade fevers. Patient appears to have a fairly high pain medication requirement relative to appearance and objective abnormalities.  Plan: Advance to frequent small feedings of low fat solid food. Patient advised. Will decrease IV fluids. Continue pain medication taper. Patient will have to be monitored carefully upon discharge for overuse of narcotic analgesics.  Kerry Fernandez, M.D. 08/13/2015 1:35 PM  Pager [phone] If no answer or after 5 PM call [phone]

## 2015-08-13 NOTE — Progress Notes (Signed)
Continue to have fentanyl 50 every 3 hours and ativan 1 mg q 6 hours. Patient walked the hallways last night.

## 2015-08-13 NOTE — Progress Notes (Signed)
PROGRESS NOTE  Kerry Fernandez OLM:[id number] DOB: 03/27/1985 DOA: 08/09/2015 PCP: Robyn Haber, MD Brief Narrative:  30 year old male with history of alcohol dependence, GERD, presented to the [facility] [place] for the third time in 48 hours for complaints of abdominal pain and nonbloody emesis. He last consumed alcohol on 08/07/17 and started having abdominal pain the same evening. CT abdomen 5/20: Mildly distended gallbladder without evidence of cholelithiasis or acute cholecystitis otherwise normal exam (pancreas reported as normal), initial lipase 183 and unremarkable LFTs. Patient transferred to [facility] for management of acute pancreatitis. [facility] was consulted.   Assessment & Plan: Acute alcoholic pancreatitis - CT abdomen: No acute findings and pancreas reported as normal.  -Abdominal ultrasound: Unremarkable.  -Initial lipase: 183. LFTs unremarkable. - Since imaging was unremarkable and lipase was only mildly elevated, there was concern that his presentation was related to some other etiology i.e. GERD with acute esophagitis, gastritis, PUD etc. Hence [facility] was consulted and assessed to have acute alcoholic pancreatitis.  - Treated with supportive care with nothing by mouth/bowel rest, IV fluids and pain medications. - Triglycerides: 50. Lactate normal. - KUB suggestive of ileus--passing flatus - Improving. Continue clear liquids. Lipase normalized. -decrease fentanyl frequency to q 3 hours -5/25-case discussed with Dr. Cristina Gong  Alcohol dependence - continue CIWA protocol including scheduled Ativan.  -required ativan x 2 evening 5/24-->improved today  Fever - Likely from acute pancreatitis  -Chest x-ray 5/22 and urine microscopy not indicative of infection. Blood cultures 2: Negative to date.  Patient does not appear septic or toxic. Treat with antipyretics and follow. Hold antimicrobials for now. -overall trending down -ESR, HIV, ANA  R-arm  furuncle -monitor clinically off abx -stable without any worsening -start abx if worsen or persistent fever  Leukocytosis - Likely secondary to pancreatitis. Improving.  Constipation -no BM x 6 days -colace and bisacodyl   Subjective: Patient continues to complain of abdominal pain requiring fentanyl IV every 3 hours. Denies any vomiting, diarrhea, worsening abdominal pain, fevers, chills, headache, neck pain. Denies any coughing, sore throat, hemoptysis. Denies any synovitis, unusual rashes.  Objective: Filed Vitals:   08/12/15 1757 08/12/15 2048 08/13/15 0459 08/13/15 0959  BP: 128/74 124/81 126/71 128/79  Pulse: 86 82 78   Temp: 98.6 F (37 C) 100.1 F (37.8 C) 99.7 F (37.6 C) 100.7 F (38.2 C)  TempSrc: Oral Oral Oral Oral  Resp: '20 21 18 20  ' Height:      Weight:      SpO2: 97% 99% 99% 98%    Intake/Output Summary (Last 24 hours) at 08/13/15 1328 Last data filed at 08/13/15 1020  Gross per 24 hour  Intake 2353.33 ml  Output   2475 ml  Net -121.67 ml   Weight change:  Exam:   General:  Pt is alert, follows commands appropriately, not in acute distress  HEENT: No icterus, No thrush, No neck mass, [place]/AT  Cardiovascular: RRR, S1/S2, no rubs, no gallops  Respiratory: Right basilar crackles. No wheezing. Good improvement.  Abdomen: Soft/+BS, epigastric tenderness without any rebound, non distended, no guarding  Extremities: No edema, No lymphangitis, No petechiae, No rashes, no synovitis   Data Reviewed: I have personally reviewed following labs and imaging studies Basic Metabolic Panel:  Recent Labs Lab 08/09/15 2320 08/10/15 0529 08/11/15 0512 08/12/15 1127 08/13/15 0610  NA 134* 135 135 135 137  K 4.2 4.1 4.5 3.9 3.5  CL 103 102 102 101 105  CO2 '22 25 24 27 24  ' GLUCOSE 124* 120* 93 96 93  BUN '12 9 8 6 6  ' CREATININE 0.83 1.04 1.02 0.87 0.85  CALCIUM 8.8* 8.4* 8.8* 8.7* 8.8*   Liver Function Tests:  Recent Labs Lab 08/09/15 1344  08/09/15 2320 08/10/15 0529 08/11/15 0512 08/13/15 0610  AST '29 28 23 23 27  ' ALT 42 35 31 27 34  ALKPHOS 48 41 36* 42 53  BILITOT 1.2 1.5* 1.3* 1.3* 1.1  PROT 7.0 7.0 6.0* 6.2* 6.1*  ALBUMIN 4.2 4.2 3.5 3.2* 2.9*    Recent Labs Lab 08/08/15 2141 08/09/15 1344 08/09/15 2320 08/10/15 0529 08/11/15 0512  LIPASE 183* 105* 105* 80* 26   No results for input(s): AMMONIA in the last 168 hours. Coagulation Profile: No results for input(s): INR, PROTIME in the last 168 hours. CBC:  Recent Labs Lab 08/09/15 2320 08/10/15 0529 08/11/15 0512 08/12/15 1127 08/13/15 0610  WBC 20.7* 18.9* 16.4* 15.1* 14.0*  NEUTROABS 18.7* 16.3*  --   --   --   HGB 15.4 13.6 13.3 12.8* 13.1  HCT 43.3 41.8 41.3 38.1* 39.4  MCV 88.5 91.7 92.6 89.4 90.8  PLT 260 244 221 251 267   Cardiac Enzymes:  Recent Labs Lab 08/10/15 0529  TROPONINI <0.03   BNP: Invalid input(s): POCBNP CBG:  Recent Labs Lab 08/10/15 1301  GLUCAP 103*   HbA1C: No results for input(s): HGBA1C in the last 72 hours. Urine analysis:    Component Value Date/Time   COLORURINE YELLOW 08/10/2015 0645   APPEARANCEUR CLEAR 08/10/2015 0645   LABSPEC 1.015 08/10/2015 0645   PHURINE 6.5 08/10/2015 0645   GLUCOSEU NEGATIVE 08/10/2015 0645   HGBUR NEGATIVE 08/10/2015 0645   BILIRUBINUR NEGATIVE 08/10/2015 0645   BILIRUBINUR neg 05/06/2014 1038   KETONESUR 15* 08/10/2015 0645   PROTEINUR NEGATIVE 08/10/2015 0645   PROTEINUR neg 05/06/2014 1038   UROBILINOGEN 0.2 05/06/2014 1038   NITRITE NEGATIVE 08/10/2015 0645   NITRITE neg 05/06/2014 1038   LEUKOCYTESUR NEGATIVE 08/10/2015 0645   Sepsis Labs: '@[email or web address]' (procalcitonin:4,lacticidven:4) ) Recent Results (from the past 240 hour(s))  Culture, blood (routine x 2)     Status: None (Preliminary result)   Collection Time: 08/10/15  7:31 AM  Result Value Ref Range Status   Specimen Description BLOOD LEFT ARM  Final   Special Requests BOTTLES DRAWN AEROBIC AND  ANAEROBIC 10CC   Final   Culture NO GROWTH 2 DAYS  Final   Report Status PENDING  Incomplete  Culture, blood (routine x 2)     Status: None (Preliminary result)   Collection Time: 08/10/15  7:38 AM  Result Value Ref Range Status   Specimen Description BLOOD LEFT HAND  Final   Special Requests BOTTLES DRAWN AEROBIC AND ANAEROBIC 5CC   Final   Culture NO GROWTH 2 DAYS  Final   Report Status PENDING  Incomplete     Scheduled Meds: . enoxaparin (LOVENOX) injection  40 mg Subcutaneous Q24H  . folic acid  1 mg Oral Daily  . LORazepam  0-4 mg Intravenous Q12H  . multivitamin with minerals  1 tablet Oral Daily  . pantoprazole (PROTONIX) IV  40 mg Intravenous Q24H  . thiamine  100 mg Oral Daily   Or  . thiamine  100 mg Intravenous Daily   Continuous Infusions: . sodium chloride 0.9 % 1,000 mL infusion 100 mL/hr at 08/13/15 1245    Procedures/Studies: US Abdomen Complete  08/10/2015  CLINICAL DATA:  Epigastric pain.  History of pancreatitis.  EXAM: ABDOMEN ULTRASOUND COMPLETE COMPARISON:  CT abdomen and pelvis 08/08/2015. FINDINGS: Gallbladder: No gallstones or wall thickening visualized. No sonographic Murphy sign noted by sonographer. Common bile duct: Diameter: 6.2 mm, upper limits normal. Liver: No focal lesion identified. Within normal limits in parenchymal echogenicity. IVC: No abnormality visualized. Pancreas: Obscured by gas. Spleen: Size and appearance within normal limits. Right Kidney: Length: 12.5 cm. Echogenicity within normal limits. No mass or hydronephrosis visualized. Left Kidney: Length: 12.0 cm. Echogenicity within normal limits. No mass or hydronephrosis visualized. Abdominal aorta: 2.3 cm. Other findings: None. IMPRESSION: Unremarkable abdominal ultrasound. Electronically Signed   By: Staci Righter M.D.   On: 08/10/2015 07:35   Ct Abdomen Pelvis W Contrast  08/08/2015  CLINICAL DATA:  30 year old presenting with acute onset of severe right lower quadrant abdominal pain  associated with nausea. Leukocytosis with [place] of [address]. EXAM: CT ABDOMEN AND PELVIS WITH CONTRAST TECHNIQUE: Multidetector CT imaging of the abdomen and pelvis was performed using the standard protocol following bolus administration of intravenous contrast. CONTRAST:  116m ISOVUE-300 IOPAMIDOL 61% IV. Oral contrast was also administered. COMPARISON:  None. FINDINGS: Lower chest:  Heart size normal.  Visualized lung bases clear. Hepatobiliary: Liver normal in size and appearance. Gallbladder mildly distended without calcified gallstones. No pericholecystic edema/inflammation. No biliary ductal dilation. Pancreas: Normal in appearance without evidence of mass, ductal dilation, or inflammation. Spleen: Normal in size and appearance. Adrenals/Urinary Tract: Normal appearing adrenal glands. Kidneys normal in size and appearance without focal parenchymal abnormality. No evidence of urinary tract calculi or obstruction. Normal-appearing urinary bladder. Stomach/Bowel: Stomach normal in appearance for the degree of distention. Normal-appearing small bowel. Expected stool burden throughout normal appearing, relatively decompressed colon. Normal appendix in the right upper pelvis. Vascular/Lymphatic: No visible aortoiliofemoral atherosclerosis. Widely patent visceral arteries. Normal-appearing portal venous and systemic venous systems. No pathologic lymphadenopathy. Reproductive: Prostate gland and seminal vesicles normal in size and appearance for age. Other: None. Musculoskeletal: Regional skeleton intact without acute or significant osseous abnormality. IMPRESSION: 1. Mildly distended gallbladder without evidence of cholelithiasis or acute cholecystitis. 2. Otherwise normal examination. Electronically Signed   By: TEvangeline DakinM.D.   On: 08/08/2015 23:35   Dg Abd Acute W/chest  08/10/2015  CLINICAL DATA:  Upper abdominal pain for 2 days. Nausea and vomiting. EXAM: DG ABDOMEN ACUTE W/ 1V CHEST  COMPARISON:  CT 08/08/2015 FINDINGS: The cardiomediastinal contours are normal. The lungs are clear. There is no free intra-abdominal air. Mild gaseous distention of small and large bowel loops in the central abdomen without obstruction. Enteric contrast from prior CT within the colon. Small volume of stool throughout the colon. Excreted intravenous contrast in the bladder. No radiopaque calculi. No acute osseous abnormalities are seen. IMPRESSION: Mild gaseous distention of small and large bowel loops in the central abdomen, may be ileus. Patient with laboratory findings compatible with pancreatitis, ileus with a common finding with pancreatitis. No obstruction, enteric contrast from CT 2 days prior within the colon. Electronically Signed   By: MJeb LeveringM.D.   On: 08/10/2015 06:27    Cailen Mihalik, DO  [facility] Pager [phone] If 7PM-7AM, please contact night-coverage [email or web address] Password [email or web address] 08/13/2015, 1:28 PM   LOS: 3 days

## 2015-08-14 DIAGNOSIS — R1013 Epigastric pain: Secondary | ICD-10-CM

## 2015-08-14 LAB — BASIC METABOLIC PANEL WITH GFR
Anion gap: 8 (ref 5–15)
BUN: 5 mg/dL — ABNORMAL LOW (ref 6–20)
CO2: 26 mmol/L (ref 22–32)
Calcium: 8.7 mg/dL — ABNORMAL LOW (ref 8.9–10.3)
Chloride: 102 mmol/L (ref 101–111)
Creatinine, Ser: 1 mg/dL (ref 0.61–1.24)
GFR calc Af Amer: >60 mL/min
GFR calc non Af Amer: >60 mL/min
Glucose, Bld: 95 mg/dL (ref 65–99)
Potassium: 3.7 mmol/L (ref 3.5–5.1)
Sodium: 136 mmol/L (ref 135–145)

## 2015-08-14 LAB — CBC
HEMATOCRIT: 38.6 % — AB (ref 39.0–52.0)
HEMOGLOBIN: 12.9 g/dL — AB (ref 13.0–17.0)
MCH: 29.9 pg (ref 26.0–34.0)
MCHC: 33.4 g/dL (ref 30.0–36.0)
MCV: 89.6 fL (ref 78.0–100.0)
Platelets: 306 10*3/uL (ref 150–400)
RBC: 4.31 MIL/uL (ref 4.22–5.81)
RDW: 12.5 % (ref 11.5–15.5)
WBC: 14.5 10*3/uL — AB (ref 4.0–10.5)

## 2015-08-14 LAB — ANTINUCLEAR ANTIBODIES, IFA: ANTINUCLEAR ANTIBODIES, IFA: NEGATIVE

## 2015-08-14 LAB — HIV ANTIBODY (ROUTINE TESTING W REFLEX): HIV Screen 4th Generation wRfx: NONREACTIVE

## 2015-08-14 LAB — LIPASE, BLOOD: Lipase: 59 U/L — ABNORMAL HIGH (ref 11–51)

## 2015-08-14 MED ORDER — ZOLPIDEM TARTRATE 5 MG PO TABS
5.0000 mg | ORAL_TABLET | Freq: Once | ORAL | Status: AC
  Administered 2015-08-14: 5 mg via ORAL
  Filled 2015-08-14: qty 1

## 2015-08-14 MED ORDER — PANTOPRAZOLE SODIUM 40 MG PO PACK
40.0000 mg | PACK | Freq: Every day | ORAL | Status: DC
  Administered 2015-08-14: 40 mg via ORAL
  Filled 2015-08-14: qty 20

## 2015-08-14 MED ORDER — PANTOPRAZOLE SODIUM 40 MG PO TBEC
40.0000 mg | DELAYED_RELEASE_TABLET | Freq: Every day | ORAL | Status: DC
  Administered 2015-08-15: 40 mg via ORAL
  Filled 2015-08-14: qty 1

## 2015-08-14 MED ORDER — DOCUSATE SODIUM 100 MG PO CAPS: 100.0000 mg | ORAL_CAPSULE | Freq: Two times a day (BID) | ORAL | Status: AC

## 2015-08-14 MED ORDER — SENNA 8.6 MG PO TABS: 2.0000 | ORAL_TABLET | Freq: Every day | ORAL | Status: AC

## 2015-08-14 MED ORDER — OXYCODONE HCL 5 MG PO TABS: 5.0000 mg | ORAL_TABLET | ORAL | Status: AC | PRN

## 2015-08-14 MED ORDER — CYCLOBENZAPRINE HCL 5 MG PO TABS
5.0000 mg | ORAL_TABLET | Freq: Three times a day (TID) | ORAL | Status: DC | PRN
  Administered 2015-08-14 – 2015-08-15 (×2): 5 mg via ORAL
  Filled 2015-08-14 (×3): qty 1

## 2015-08-14 MED ORDER — OXYCODONE HCL 5 MG PO TABS
5.0000 mg | ORAL_TABLET | ORAL | Status: DC | PRN
  Administered 2015-08-14 – 2015-08-15 (×5): 5 mg via ORAL
  Filled 2015-08-14 (×5): qty 1

## 2015-08-14 MED ORDER — SENNA 8.6 MG PO TABS
2.0000 | ORAL_TABLET | Freq: Every day | ORAL | Status: DC
  Administered 2015-08-14 – 2015-08-15 (×2): 17.2 mg via ORAL
  Filled 2015-08-14 (×2): qty 2

## 2015-08-14 NOTE — Progress Notes (Signed)
PROGRESS NOTE  Kerry Fernandez VOZ:[id number] DOB: Nov 22, 1985 DOA: 08/09/2015 PCP: Robyn Haber, MD  Brief Narrative:  29 year old male with history of alcohol dependence, GERD, presented to the [facility] [place] for the third time in 48 hours for complaints of abdominal pain and nonbloody emesis. He last consumed alcohol on 08/07/17 and started having abdominal pain the same evening. CT abdomen 5/20: Mildly distended gallbladder without evidence of cholelithiasis or acute cholecystitis otherwise normal exam (pancreas reported as normal), initial lipase 183 and unremarkable LFTs. Patient transferred to [facility] for management of acute pancreatitis. [facility] was consulted.   Assessment & Plan: Acute alcoholic pancreatitis - CT abdomen: No acute findings and pancreas reported as normal.  -Abdominal ultrasound: Unremarkable.  -Initial lipase: 183. LFTs unremarkable. - Since imaging was unremarkable and lipase was only mildly elevated, there was concern that his presentation was related to some other etiology i.e. GERD with acute esophagitis, gastritis, PUD etc. Hence [facility] was consulted and assessed to have acute alcoholic pancreatitis.  - Treated with supportive care with nothing by mouth/bowel rest, IV fluids and pain medications. - Triglycerides: 50. Lactate normal. - KUB suggestive of ileus--passing flatus-->+BM on 5/25 - Improving. Lipase normalized. -tolerating low fat diet -5/25-case discussed with Dr. Cristina Gong -switch to po oxycodone 5 mg q 4 hour prn pain--pt states pain present but controlled  Alcohol dependence - continue CIWA protocol including scheduled Ativan.  -required ativan x 2 evening 5/24-->improved today  Fever - Likely from acute pancreatitis inflammatory response -Chest x-ray 5/22 and urine microscopy not indicative of infection. Blood cultures 2: Negative to date.  -Patient does not appear septic or toxic. Treat with antipyretics and follow. Hold  antimicrobials for now. -overall trending down--no fever x 24 hours -ESR--74 -HIV, ANA--neg  R-arm furuncle -monitor clinically off abx -stable without any worsening -start abx if worsen or persistent fever  Leukocytosis - Likely secondary to pancreatitis. Overall Improving.  Constipation -no BM x 6 days -colace and bisacodyl-->+BM  Musculoskeletal back pain -flexeril prn   Disposition Plan:   Home 08/15/15 if cleared by GI Family Communication:   No Family at beside  Consultants:  [facility]  Code Status:  FULL    Subjective: Patient is tolerating a low-fat diet. He still has some epigastric abdominal pain, but states that this is controlled. Denies any fevers, chills, chest pain, short of breath, vomiting, diarrhea, hematochezia, melena.  Objective: Filed Vitals:   08/14/15 0330 08/14/15 0415 08/14/15 0931 08/14/15 1746  BP:  124/74 120/73 128/76  Pulse:  67 83 83  Temp: 99.6 F (37.6 C)  99.5 F (37.5 C) 99.5 F (37.5 C)  TempSrc:   Oral Oral  Resp:  '16 18 19  ' Height:      Weight:      SpO2:  99% 100% 100%    Intake/Output Summary (Last 24 hours) at 08/14/15 1809 Last data filed at 08/14/15 1437  Gross per 24 hour  Intake   5220 ml  Output   1301 ml  Net   3919 ml   Weight change:  Exam:   General:  Pt is alert, follows commands appropriately, not in acute distress  HEENT: No icterus, No thrush, No neck mass, [place]/AT  Cardiovascular: RRR, S1/S2, no rubs, no gallops  Respiratory: CTA bilaterally, no wheezing, no crackles, no rhonchi  Abdomen: Soft/+BS, Epigastric pain without any rebound, non distended, no guarding  Extremities: No edema, No lymphangitis, No petechiae, No  rashes, no synovitis; right upper extremity wound at the lateral aspect of the distal humerus without any drainage, induration, necrosis.   Data Reviewed: I have personally reviewed following labs and imaging studies Basic Metabolic Panel:  Recent Labs Lab 08/10/15 0529  08/11/15 0512 08/12/15 1127 08/13/15 0610 08/14/15 0457  NA 135 135 135 137 136  K 4.1 4.5 3.9 3.5 3.7  CL 102 102 101 105 102  CO2 '25 24 27 24 26  ' GLUCOSE 120* 93 96 93 95  BUN '9 8 6 6 ' 5*  CREATININE 1.04 1.02 0.87 0.85 1.00  CALCIUM 8.4* 8.8* 8.7* 8.8* 8.7*   Liver Function Tests:  Recent Labs Lab 08/09/15 1344 08/09/15 2320 08/10/15 0529 08/11/15 0512 08/13/15 0610  AST '29 28 23 23 27  ' ALT 42 35 31 27 34  ALKPHOS 48 41 36* 42 53  BILITOT 1.2 1.5* 1.3* 1.3* 1.1  PROT 7.0 7.0 6.0* 6.2* 6.1*  ALBUMIN 4.2 4.2 3.5 3.2* 2.9*    Recent Labs Lab 08/09/15 1344 08/09/15 2320 08/10/15 0529 08/11/15 0512 08/14/15 0457  LIPASE 105* 105* 80* 26 59*   No results for input(s): AMMONIA in the last 168 hours. Coagulation Profile: No results for input(s): INR, PROTIME in the last 168 hours. CBC:  Recent Labs Lab 08/09/15 2320 08/10/15 0529 08/11/15 0512 08/12/15 1127 08/13/15 0610 08/14/15 0457  WBC 20.7* 18.9* 16.4* 15.1* 14.0* 14.5*  NEUTROABS 18.7* 16.3*  --   --   --   --   HGB 15.4 13.6 13.3 12.8* 13.1 12.9*  HCT 43.3 41.8 41.3 38.1* 39.4 38.6*  MCV 88.5 91.7 92.6 89.4 90.8 89.6  PLT 260 244 221 251 267 306   Cardiac Enzymes:  Recent Labs Lab 08/10/15 0529  TROPONINI <0.03   BNP: Invalid input(s): POCBNP CBG:  Recent Labs Lab 08/10/15 1301  GLUCAP 103*   HbA1C: No results for input(s): HGBA1C in the last 72 hours. Urine analysis:    Component Value Date/Time   COLORURINE YELLOW 08/10/2015 0645   APPEARANCEUR CLEAR 08/10/2015 0645   LABSPEC 1.015 08/10/2015 0645   PHURINE 6.5 08/10/2015 0645   GLUCOSEU NEGATIVE 08/10/2015 0645   HGBUR NEGATIVE 08/10/2015 0645   BILIRUBINUR NEGATIVE 08/10/2015 0645   BILIRUBINUR neg 05/06/2014 1038   KETONESUR 15* 08/10/2015 0645   PROTEINUR NEGATIVE 08/10/2015 0645   PROTEINUR neg 05/06/2014 1038   UROBILINOGEN 0.2 05/06/2014 1038   NITRITE NEGATIVE 08/10/2015 0645   NITRITE neg 05/06/2014 1038    LEUKOCYTESUR NEGATIVE 08/10/2015 0645   Sepsis Labs: '@[email or web address]' (procalcitonin:4,lacticidven:4) ) Recent Results (from the past 240 hour(s))  Culture, blood (routine x 2)     Status: None (Preliminary result)   Collection Time: 08/10/15  7:31 AM  Result Value Ref Range Status   Specimen Description BLOOD LEFT ARM  Final   Special Requests BOTTLES DRAWN AEROBIC AND ANAEROBIC 10CC   Final   Culture NO GROWTH 4 DAYS  Final   Report Status PENDING  Incomplete  Culture, blood (routine x 2)     Status: None (Preliminary result)   Collection Time: 08/10/15  7:38 AM  Result Value Ref Range Status   Specimen Description BLOOD LEFT HAND  Final   Special Requests BOTTLES DRAWN AEROBIC AND ANAEROBIC 5CC   Final   Culture NO GROWTH 4 DAYS  Final   Report Status PENDING  Incomplete     Scheduled Meds: . docusate sodium  100 mg Oral BID  . enoxaparin (LOVENOX) injection  40 mg Subcutaneous Q24H  .  folic acid  1 mg Oral Daily  . multivitamin with minerals  1 tablet Oral Daily  . pantoprazole  40 mg Oral Daily  . thiamine  100 mg Oral Daily   Continuous Infusions: . sodium chloride 0.9 % 1,000 mL infusion 100 mL/hr at 08/14/15 1941    Procedures/Studies: US Abdomen Complete  08/10/2015  CLINICAL DATA:  Epigastric pain.  History of pancreatitis. EXAM: ABDOMEN ULTRASOUND COMPLETE COMPARISON:  CT abdomen and pelvis 08/08/2015. FINDINGS: Gallbladder: No gallstones or wall thickening visualized. No sonographic Murphy sign noted by sonographer. Common bile duct: Diameter: 6.2 mm, upper limits normal. Liver: No focal lesion identified. Within normal limits in parenchymal echogenicity. IVC: No abnormality visualized. Pancreas: Obscured by gas. Spleen: Size and appearance within normal limits. Right Kidney: Length: 12.5 cm. Echogenicity within normal limits. No mass or hydronephrosis visualized. Left Kidney: Length: 12.0 cm. Echogenicity within normal limits. No mass or hydronephrosis visualized. Abdominal  aorta: 2.3 cm. Other findings: None. IMPRESSION: Unremarkable abdominal ultrasound. Electronically Signed   By: Staci Righter M.D.   On: 08/10/2015 07:35   Ct Abdomen Pelvis W Contrast  08/08/2015  CLINICAL DATA:  30 year old presenting with acute onset of severe right lower quadrant abdominal pain associated with nausea. Leukocytosis with [place] of [address]. EXAM: CT ABDOMEN AND PELVIS WITH CONTRAST TECHNIQUE: Multidetector CT imaging of the abdomen and pelvis was performed using the standard protocol following bolus administration of intravenous contrast. CONTRAST:  131m ISOVUE-300 IOPAMIDOL 61% IV. Oral contrast was also administered. COMPARISON:  None. FINDINGS: Lower chest:  Heart size normal.  Visualized lung bases clear. Hepatobiliary: Liver normal in size and appearance. Gallbladder mildly distended without calcified gallstones. No pericholecystic edema/inflammation. No biliary ductal dilation. Pancreas: Normal in appearance without evidence of mass, ductal dilation, or inflammation. Spleen: Normal in size and appearance. Adrenals/Urinary Tract: Normal appearing adrenal glands. Kidneys normal in size and appearance without focal parenchymal abnormality. No evidence of urinary tract calculi or obstruction. Normal-appearing urinary bladder. Stomach/Bowel: Stomach normal in appearance for the degree of distention. Normal-appearing small bowel. Expected stool burden throughout normal appearing, relatively decompressed colon. Normal appendix in the right upper pelvis. Vascular/Lymphatic: No visible aortoiliofemoral atherosclerosis. Widely patent visceral arteries. Normal-appearing portal venous and systemic venous systems. No pathologic lymphadenopathy. Reproductive: Prostate gland and seminal vesicles normal in size and appearance for age. Other: None. Musculoskeletal: Regional skeleton intact without acute or significant osseous abnormality. IMPRESSION: 1. Mildly distended gallbladder without  evidence of cholelithiasis or acute cholecystitis. 2. Otherwise normal examination. Electronically Signed   By: TEvangeline DakinM.D.   On: 08/08/2015 23:35   Dg Abd Acute W/chest  08/10/2015  CLINICAL DATA:  Upper abdominal pain for 2 days. Nausea and vomiting. EXAM: DG ABDOMEN ACUTE W/ 1V CHEST COMPARISON:  CT 08/08/2015 FINDINGS: The cardiomediastinal contours are normal. The lungs are clear. There is no free intra-abdominal air. Mild gaseous distention of small and large bowel loops in the central abdomen without obstruction. Enteric contrast from prior CT within the colon. Small volume of stool throughout the colon. Excreted intravenous contrast in the bladder. No radiopaque calculi. No acute osseous abnormalities are seen. IMPRESSION: Mild gaseous distention of small and large bowel loops in the central abdomen, may be ileus. Patient with laboratory findings compatible with pancreatitis, ileus with a common finding with pancreatitis. No obstruction, enteric contrast from CT 2 days prior within the colon. Electronically Signed   By: MJeb LeveringM.D.   On: 08/10/2015 06:27    Louise Victory, DO  [facility] Pager [phone]  If 7PM-7AM, please contact night-coverage [email or web address] Password [email or web address] 08/14/2015, 6:09 PM   LOS: 4 days

## 2015-08-14 NOTE — Discharge Summary (Signed)
Physician Discharge Summary  Kerry Fernandez PFY:[id number] DOB: [birth year] DOA: 08/09/2015  PCP: Robyn Haber, MD  Admit date: 08/09/2015 Discharge date: 08/15/15  Admitted From: Home Disposition:  HOME Recommendations for Outpatient Follow-up:  1. Follow up with PCP in 1-2 weeks 2. Please obtain BMP/CBC in one week 3. Follow up with GI, Dr. Cristina Gong in one week  Home Health:No Equipment/Devices:None  Discharge Condition:stable CODE STATUS:FULL Diet recommendation: Heart Healthy(Low Fat)   Brief/Interim Summary 30 year old male with history of alcohol dependence, GERD, presented to the [facility] [place] for the third time in 48 hours for complaints of abdominal pain and nonbloody emesis. He last consumed alcohol on 08/07/17 and started having abdominal pain the same evening. CT abdomen 5/20: Mildly distended gallbladder without evidence of cholelithiasis or acute cholecystitis otherwise normal exam (pancreas reported as normal), initial lipase 183 and unremarkable LFTs. Patient transferred to [facility] for management of acute pancreatitis. [facility] was consulted.The patient was placed on bowel rest and started on intravenous fluids. His pain was controlled with intravenous opioids. The patient gradually improved although he continued to have intermittent fevers and leukocytosis. Fortunately, his fevers continued to improve and his WBCs declined.  Discharge Diagnoses:  Acute alcoholic pancreatitis - CT abdomen: No acute findings and pancreas reported as normal.  -Abdominal ultrasound: Unremarkable.  -Initial lipase: 183. LFTs unremarkable. - Since imaging was unremarkable and lipase was only mildly elevated, there was concern that his presentation was related to some other etiology i.e. GERD with acute esophagitis, gastritis, PUD etc. Hence [facility] was consulted and assessed to have acute alcoholic pancreatitis.  - Treated with supportive care with nothing by mouth/bowel rest, IV  fluids and pain medications. - Triglycerides: 50. Lactate normal. - KUB suggestive of ileus--passing flatus-->+BM on 5/25 - Improving. Lipase normalized. -tolerating low fat diet -5/25-case discussed with Dr. Cristina Gong -switch to po oxycodone 5 mg q 4 hour prn pain--pt states pain present but controlled  Alcohol dependence - continue CIWA protocol including scheduled Ativan.  -required ativan x 2 evening 5/24-->improved today  Fever - Likely from acute pancreatitis inflammatory response -Chest x-ray 5/22 and urine microscopy not indicative of infection. Blood cultures 2: Negative to date.  -Patient does not appear septic or toxic. Treat with antipyretics and follow. Hold antimicrobials for now. -overall trending down--no fever x 24 hours -ESR--74 -HIV, ANA--neg  R-arm furuncle -monitor clinically off abx -stable without any worsening -start abx if worsen or persistent fever  Leukocytosis - Likely secondary to pancreatitis. Overall Improving. -WBC 20.7-->13.2  Constipation -no BM x 6 days -colace and bisacodyl-->+BM -remain on colace and senna after d/c  Musculoskeletal back pain -flexeril prn   Discharge Instructions      Discharge Instructions    Diet - low sodium heart healthy    Complete by:  As directed      Increase activity slowly    Complete by:  As directed             Medication List    STOP taking these medications        ibuprofen 200 MG tablet  Commonly known as:  ADVIL,MOTRIN     oxyCODONE-acetaminophen 5-325 MG tablet  Commonly known as:  PERCOCET/ROXICET     vitamin C 1000 MG tablet      TAKE these medications        docusate sodium 100 MG capsule  Commonly known as:  COLACE  Take 1 capsule (100 mg total) by mouth 2 (two) times daily.  famotidine 20 MG tablet  Commonly known as:  PEPCID  Take 1 tablet (20 mg total) by mouth 2 (two) times daily.     ondansetron 4 MG tablet  Commonly known as:  ZOFRAN  Take 1 tablet (4 mg  total) by mouth every 6 (six) hours.     oxyCODONE 5 MG immediate release tablet  Commonly known as:  Oxy IR/ROXICODONE  Take 1 tablet (5 mg total) by mouth every 4 (four) hours as needed for moderate pain.     pantoprazole 40 MG tablet  Commonly known as:  PROTONIX  Take 1 tablet (40 mg total) by mouth daily.     senna 8.6 MG Tabs tablet  Commonly known as:  SENOKOT  Take 2 tablets (17.2 mg total) by mouth daily.     VISINE OP  Place 1 drop into both eyes daily as needed (dry eyes).        Allergies  Allergen Reactions  . Penicillins Other (See Comments)    Unknown childhood allergic reaction Has patient had a PCN reaction causing immediate rash, facial/tongue/throat swelling, SOB or lightheadedness with hypotension: No Has patient had a PCN reaction causing severe rash involving mucus membranes or skin necrosis: No Has patient had a PCN reaction that required hospitalization No Has patient had a PCN reaction occurring within the last 10 years: No If all of the above answers are "NO", then may proceed with Cephalosporin use.    Consultations:  [facility]   Procedures/Studies: US Abdomen Complete  08/10/2015  CLINICAL DATA:  Epigastric pain.  History of pancreatitis. EXAM: ABDOMEN ULTRASOUND COMPLETE COMPARISON:  CT abdomen and pelvis 08/08/2015. FINDINGS: Gallbladder: No gallstones or wall thickening visualized. No sonographic Murphy sign noted by sonographer. Common bile duct: Diameter: 6.2 mm, upper limits normal. Liver: No focal lesion identified. Within normal limits in parenchymal echogenicity. IVC: No abnormality visualized. Pancreas: Obscured by gas. Spleen: Size and appearance within normal limits. Right Kidney: Length: 12.5 cm. Echogenicity within normal limits. No mass or hydronephrosis visualized. Left Kidney: Length: 12.0 cm. Echogenicity within normal limits. No mass or hydronephrosis visualized. Abdominal aorta: 2.3 cm. Other findings: None. IMPRESSION: Unremarkable  abdominal ultrasound. Electronically Signed   By: Staci Righter M.D.   On: 08/10/2015 07:35   Ct Abdomen Pelvis W Contrast  08/08/2015  CLINICAL DATA:  30 year old presenting with acute onset of severe right lower quadrant abdominal pain associated with nausea. Leukocytosis with [place] blood count of [address]. EXAM: CT ABDOMEN AND PELVIS WITH CONTRAST TECHNIQUE: Multidetector CT imaging of the abdomen and pelvis was performed using the standard protocol following bolus administration of intravenous contrast. CONTRAST:  127m ISOVUE-300 IOPAMIDOL 61% IV. Oral contrast was also administered. COMPARISON:  None. FINDINGS: Lower chest:  Heart size normal.  Visualized lung bases clear. Hepatobiliary: Liver normal in size and appearance. Gallbladder mildly distended without calcified gallstones. No pericholecystic edema/inflammation. No biliary ductal dilation. Pancreas: Normal in appearance without evidence of mass, ductal dilation, or inflammation. Spleen: Normal in size and appearance. Adrenals/Urinary Tract: Normal appearing adrenal glands. Kidneys normal in size and appearance without focal parenchymal abnormality. No evidence of urinary tract calculi or obstruction. Normal-appearing urinary bladder. Stomach/Bowel: Stomach normal in appearance for the degree of distention. Normal-appearing small bowel. Expected stool burden throughout normal appearing, relatively decompressed colon. Normal appendix in the right upper pelvis. Vascular/Lymphatic: No visible aortoiliofemoral atherosclerosis. Widely patent visceral arteries. Normal-appearing portal venous and systemic venous systems. No pathologic lymphadenopathy. Reproductive: Prostate gland and seminal vesicles normal in size and  appearance for age. Other: None. Musculoskeletal: Regional skeleton intact without acute or significant osseous abnormality. IMPRESSION: 1. Mildly distended gallbladder without evidence of cholelithiasis or acute cholecystitis. 2. Otherwise  normal examination. Electronically Signed   By: Evangeline Dakin M.D.   On: 08/08/2015 23:35   Dg Abd Acute W/chest  08/10/2015  CLINICAL DATA:  Upper abdominal pain for 2 days. Nausea and vomiting. EXAM: DG ABDOMEN ACUTE W/ 1V CHEST COMPARISON:  CT 08/08/2015 FINDINGS: The cardiomediastinal contours are normal. The lungs are clear. There is no free intra-abdominal air. Mild gaseous distention of small and large bowel loops in the central abdomen without obstruction. Enteric contrast from prior CT within the colon. Small volume of stool throughout the colon. Excreted intravenous contrast in the bladder. No radiopaque calculi. No acute osseous abnormalities are seen. IMPRESSION: Mild gaseous distention of small and large bowel loops in the central abdomen, may be ileus. Patient with laboratory findings compatible with pancreatitis, ileus with a common finding with pancreatitis. No obstruction, enteric contrast from CT 2 days prior within the colon. Electronically Signed   By: Jeb Levering M.D.   On: 08/10/2015 06:27       Discharge Exam: Filed Vitals:   08/15/15 0503 08/15/15 0900  BP: 124/76 100/52  Pulse: 67 60  Temp: 99.5 F (37.5 C) 98.8 F (37.1 C)  Resp: 20 20   Filed Vitals:   08/14/15 1746 08/14/15 2052 08/15/15 0503 08/15/15 0900  BP: 128/76 128/69 124/76 100/52  Pulse: 83 72 67 60  Temp: 99.5 F (37.5 C) 99.8 F (37.7 C) 99.5 F (37.5 C) 98.8 F (37.1 C)  TempSrc: Oral Oral Oral Oral  Resp: '19 20 20 20  ' Height:      Weight:      SpO2: 100% 99% 97% 98%    General: Pt is alert, awake, not in acute distress Cardiovascular: RRR, S1/S2 +, no rubs, no gallops Respiratory: CTA bilaterally, no wheezing, no rhonchi Abdominal: Soft, epigastric pain without rebound, ND, bowel sounds + Extremities: no edema, no cyanosis   The results of significant diagnostics from this hospitalization (including imaging, microbiology, ancillary and laboratory) are listed below for  reference.    Significant Diagnostic Studies: US Abdomen Complete  08/10/2015  CLINICAL DATA:  Epigastric pain.  History of pancreatitis. EXAM: ABDOMEN ULTRASOUND COMPLETE COMPARISON:  CT abdomen and pelvis 08/08/2015. FINDINGS: Gallbladder: No gallstones or wall thickening visualized. No sonographic Murphy sign noted by sonographer. Common bile duct: Diameter: 6.2 mm, upper limits normal. Liver: No focal lesion identified. Within normal limits in parenchymal echogenicity. IVC: No abnormality visualized. Pancreas: Obscured by gas. Spleen: Size and appearance within normal limits. Right Kidney: Length: 12.5 cm. Echogenicity within normal limits. No mass or hydronephrosis visualized. Left Kidney: Length: 12.0 cm. Echogenicity within normal limits. No mass or hydronephrosis visualized. Abdominal aorta: 2.3 cm. Other findings: None. IMPRESSION: Unremarkable abdominal ultrasound. Electronically Signed   By: Staci Righter M.D.   On: 08/10/2015 07:35   Ct Abdomen Pelvis W Contrast  08/08/2015  CLINICAL DATA:  30 year old presenting with acute onset of severe right lower quadrant abdominal pain associated with nausea. Leukocytosis with [place] blood count of [address]. EXAM: CT ABDOMEN AND PELVIS WITH CONTRAST TECHNIQUE: Multidetector CT imaging of the abdomen and pelvis was performed using the standard protocol following bolus administration of intravenous contrast. CONTRAST:  145m ISOVUE-300 IOPAMIDOL 61% IV. Oral contrast was also administered. COMPARISON:  None. FINDINGS: Lower chest:  Heart size normal.  Visualized lung bases clear. Hepatobiliary: Liver normal  in size and appearance. Gallbladder mildly distended without calcified gallstones. No pericholecystic edema/inflammation. No biliary ductal dilation. Pancreas: Normal in appearance without evidence of mass, ductal dilation, or inflammation. Spleen: Normal in size and appearance. Adrenals/Urinary Tract: Normal appearing adrenal glands. Kidneys normal in size and  appearance without focal parenchymal abnormality. No evidence of urinary tract calculi or obstruction. Normal-appearing urinary bladder. Stomach/Bowel: Stomach normal in appearance for the degree of distention. Normal-appearing small bowel. Expected stool burden throughout normal appearing, relatively decompressed colon. Normal appendix in the right upper pelvis. Vascular/Lymphatic: No visible aortoiliofemoral atherosclerosis. Widely patent visceral arteries. Normal-appearing portal venous and systemic venous systems. No pathologic lymphadenopathy. Reproductive: Prostate gland and seminal vesicles normal in size and appearance for age. Other: None. Musculoskeletal: Regional skeleton intact without acute or significant osseous abnormality. IMPRESSION: 1. Mildly distended gallbladder without evidence of cholelithiasis or acute cholecystitis. 2. Otherwise normal examination. Electronically Signed   By: Evangeline Dakin M.D.   On: 08/08/2015 23:35   Dg Abd Acute W/chest  08/10/2015  CLINICAL DATA:  Upper abdominal pain for 2 days. Nausea and vomiting. EXAM: DG ABDOMEN ACUTE W/ 1V CHEST COMPARISON:  CT 08/08/2015 FINDINGS: The cardiomediastinal contours are normal. The lungs are clear. There is no free intra-abdominal air. Mild gaseous distention of small and large bowel loops in the central abdomen without obstruction. Enteric contrast from prior CT within the colon. Small volume of stool throughout the colon. Excreted intravenous contrast in the bladder. No radiopaque calculi. No acute osseous abnormalities are seen. IMPRESSION: Mild gaseous distention of small and large bowel loops in the central abdomen, may be ileus. Patient with laboratory findings compatible with pancreatitis, ileus with a common finding with pancreatitis. No obstruction, enteric contrast from CT 2 days prior within the colon. Electronically Signed   By: Jeb Levering M.D.   On: 08/10/2015 06:27     Microbiology: Recent Results (from  the past 240 hour(s))  Culture, blood (routine x 2)     Status: None   Collection Time: 08/10/15  7:31 AM  Result Value Ref Range Status   Specimen Description BLOOD LEFT ARM  Final   Special Requests BOTTLES DRAWN AEROBIC AND ANAEROBIC 10CC   Final   Culture NO GROWTH 5 DAYS  Final   Report Status 08/15/2015 FINAL  Final  Culture, blood (routine x 2)     Status: None   Collection Time: 08/10/15  7:38 AM  Result Value Ref Range Status   Specimen Description BLOOD LEFT HAND  Final   Special Requests BOTTLES DRAWN AEROBIC AND ANAEROBIC 5CC   Final   Culture NO GROWTH 5 DAYS  Final   Report Status 08/15/2015 FINAL  Final     Labs: Basic Metabolic Panel:  Recent Labs Lab 08/10/15 0529 08/11/15 0512 08/12/15 1127 08/13/15 0610 08/14/15 0457  NA 135 135 135 137 136  K 4.1 4.5 3.9 3.5 3.7  CL 102 102 101 105 102  CO2 '25 24 27 24 26  ' GLUCOSE 120* 93 96 93 95  BUN '9 8 6 6 ' 5*  CREATININE 1.04 1.02 0.87 0.85 1.00  CALCIUM 8.4* 8.8* 8.7* 8.8* 8.7*   Liver Function Tests:  Recent Labs Lab 08/09/15 1344 08/09/15 2320 08/10/15 0529 08/11/15 0512 08/13/15 0610  AST '29 28 23 23 27  ' ALT 42 35 31 27 34  ALKPHOS 48 41 36* 42 53  BILITOT 1.2 1.5* 1.3* 1.3* 1.1  PROT 7.0 7.0 6.0* 6.2* 6.1*  ALBUMIN 4.2 4.2 3.5 3.2* 2.9*  Recent Labs Lab 08/09/15 2320 08/10/15 0529 08/11/15 0512 08/14/15 0457 08/15/15 0945  LIPASE 105* 80* 26 59* 54*   No results for input(s): AMMONIA in the last 168 hours. CBC:  Recent Labs Lab 08/09/15 2320 08/10/15 0529 08/11/15 0512 08/12/15 1127 08/13/15 0610 08/14/15 0457 08/15/15 0945  WBC 20.7* 18.9* 16.4* 15.1* 14.0* 14.5* 13.2*  NEUTROABS 18.7* 16.3*  --   --   --   --  9.4*  HGB 15.4 13.6 13.3 12.8* 13.1 12.9* 12.5*  HCT 43.3 41.8 41.3 38.1* 39.4 38.6* 37.3*  MCV 88.5 91.7 92.6 89.4 90.8 89.6 90.1  PLT 260 244 221 251 267 306 305   Cardiac Enzymes:  Recent Labs Lab 08/10/15 0529  TROPONINI <0.03   BNP: Invalid input(s):  POCBNP CBG:  Recent Labs Lab 08/10/15 1301  GLUCAP 103*    Time coordinating discharge:  Greater than 30 minutes  Signed:  Keyonia Gluth, DO [facility] Pager: [phone] 08/15/2015, 1:05 PM

## 2015-08-14 NOTE — Plan of Care (Signed)
Problem: Pain Managment: Goal: General experience of comfort will improve Outcome: Progressing Pain medication changed to PO oxycodone today. Patient tolerating well.

## 2015-08-15 DIAGNOSIS — R109 Unspecified abdominal pain: Secondary | ICD-10-CM | POA: Insufficient documentation

## 2015-08-15 LAB — CBC WITH DIFFERENTIAL/PLATELET
BASOS ABS: 0 10*3/uL (ref 0.0–0.1)
BASOS PCT: 0 %
Eosinophils Absolute: 0.3 10*3/uL (ref 0.0–0.7)
Eosinophils Relative: 2 %
HEMATOCRIT: 37.3 % — AB (ref 39.0–52.0)
HEMOGLOBIN: 12.5 g/dL — AB (ref 13.0–17.0)
Lymphocytes Relative: 19 %
Lymphs Abs: 2.5 10*3/uL (ref 0.7–4.0)
MCH: 30.2 pg (ref 26.0–34.0)
MCHC: 33.5 g/dL (ref 30.0–36.0)
MCV: 90.1 fL (ref 78.0–100.0)
Monocytes Absolute: 1 10*3/uL (ref 0.1–1.0)
Monocytes Relative: 7 %
NEUTROS ABS: 9.4 10*3/uL — AB (ref 1.7–7.7)
NEUTROS PCT: 72 %
Platelets: 305 10*3/uL (ref 150–400)
RBC: 4.14 MIL/uL — ABNORMAL LOW (ref 4.22–5.81)
RDW: 12.5 % (ref 11.5–15.5)
WBC: 13.2 10*3/uL — ABNORMAL HIGH (ref 4.0–10.5)

## 2015-08-15 LAB — CULTURE, BLOOD (ROUTINE X 2)
Culture: NO GROWTH
Culture: NO GROWTH

## 2015-08-15 LAB — LIPASE, BLOOD: Lipase: 54 U/L — ABNORMAL HIGH (ref 11–51)

## 2015-08-15 NOTE — Progress Notes (Signed)
Dr. Don Perkingat's note reviewed.  Have ordered stat labs so we will have result to help determine if dischg today is appropriate.  I will plan to see pt around lunchtime.  Florencia Reasonsobert V. Santoria Chason, M.D. Pager [phone] If no answer or after 5 PM call [phone]

## 2015-08-15 NOTE — Progress Notes (Signed)
Pt had a light lunch and states he feels well and ready to go home.  Abd NT.  WBC and Lipase are both slightly improved from yesterday==both are still slightly above nl.  IMPR:  Resolving etoh pancreatitis  RECOMM:  1.  Ok for dischg 2.  Pt advised to use lax prn to keep bowels moving while on pn meds 3. Pt asking for sleeping pill for the next week or so--apparently Ambien worked well for him last night, will defer this to discharging MD. 4. Have given pt my card and asked him to f/u w/ me in 1-2 mos, calling sooner if problems.  Also recomm'd him establishing w/ a PCP. 5.  EtOH avoidance (completely) again advised. 6.  Call me if questions.  Florencia Reasonsobert V. Verna Desrocher, M.D. Pager [phone] If no answer or after 5 PM call [phone]

## 2015-08-25 ENCOUNTER — Ambulatory Visit (INDEPENDENT_AMBULATORY_CARE_PROVIDER_SITE_OTHER): Payer: Managed Care, Other (non HMO) | Admitting: Podiatry

## 2015-08-25 ENCOUNTER — Encounter: Payer: Self-pay | Admitting: Podiatry

## 2015-08-25 ENCOUNTER — Ambulatory Visit (INDEPENDENT_AMBULATORY_CARE_PROVIDER_SITE_OTHER): Payer: Managed Care, Other (non HMO)

## 2015-08-25 VITALS — BP 133/80 | HR 71 | Resp 12

## 2015-08-25 DIAGNOSIS — R52 Pain, unspecified: Secondary | ICD-10-CM

## 2015-08-25 DIAGNOSIS — M779 Enthesopathy, unspecified: Secondary | ICD-10-CM

## 2015-08-25 DIAGNOSIS — M7752 Other enthesopathy of left foot: Secondary | ICD-10-CM

## 2015-08-25 MED ORDER — MELOXICAM 15 MG PO TABS: 15.0000 mg | ORAL_TABLET | Freq: Every day | ORAL | Status: DC

## 2015-08-25 NOTE — Progress Notes (Signed)
Subjective:     Patient ID: Kerry Fernandez, male   DOB: 1985-10-13, 30 y.o.   MRN: [id number]  HPI this patient presents to the office with chief complaint of painful throbbing area on the back of his left heel. He states that this heel pain has been present for approximately 5-6 days. He denies any history of trauma or reinjury to the foot. He says he has severe pain noted in this area upon rising in the morning. He also says that the pressure from shoes is painful at this site. He points to the bone spur that is noted on the outside back of his left heel. He presents the office for an evaluation and treatment of this condition   Review of Systems     Objective:   Physical Exam GENERAL APPEARANCE: Alert, conversant. Appropriately groomed. No acute distress.  VASCULAR: Pedal pulses are  palpable at  [facility].  Capillary refill time is immediate to all digits,  Normal temperature gradient.  Digital hair growth is present bilateral  NEUROLOGIC: sensation is normal to 5.07 monofilament at 5/5 sites bilateral.  Light touch is intact bilateral, Muscle strength normal.  MUSCULOSKELETAL: acceptable muscle strength, tone and stability bilateral.  Intrinsic muscluature intact bilateral.  Rectus appearance of foot and digits noted bilateral. Haglunds deformity left foot.  DERMATOLOGIC: skin color, texture, and turgor are within normal limits.  No preulcerative lesions or ulcers  are seen, no interdigital maceration noted.  No open lesions present.  Digital nails are asymptomatic. No drainage noted.      Assessment:     Bone spur left foot.  Retrocalcaneal exostosis left heel.     Plan:     ROV  Xray taken reveal no pathology.  Prescribe Mobic.  Dispense powersteps for running  Since he is a runner.  RTC 3 weeks.   Helane GuntherGregory Linzi Ohlinger DPM

## 2015-09-16 ENCOUNTER — Ambulatory Visit (INDEPENDENT_AMBULATORY_CARE_PROVIDER_SITE_OTHER): Payer: Managed Care, Other (non HMO) | Admitting: Podiatry

## 2015-09-16 VITALS — BP 124/72 | HR 73 | Resp 14

## 2015-09-16 DIAGNOSIS — L600 Ingrowing nail: Secondary | ICD-10-CM

## 2015-09-16 DIAGNOSIS — L03032 Cellulitis of left toe: Secondary | ICD-10-CM

## 2015-09-16 NOTE — Progress Notes (Signed)
Subjective:     Patient ID: Kerry Fernandez, male   DOB: 1985/07/02, [age] y.o.   MRN: [id number]  HPI this patient presents the office with chief complaint of pain on the inside border big toe, right foot. He states that his been painful for 2-4 weeks now and he has difficulty walking due to the pain. He previously had another ingrown toenail, performed by myself in this office. He desires to have the ingrown ring toenail evaluated and definitively treated.   Review of Systems     Objective:   Physical Exam GENERAL APPEARANCE: Alert, conversant. Appropriately groomed. No acute distress.  VASCULAR: Pedal pulses are  palpable at  [facility] and [facility] bilateral.  Capillary refill time is immediate to all digits,  Normal temperature gradient.  Digital hair growth is present bilateral  NEUROLOGIC: sensation is normal to 5.07 monofilament at 5/5 sites bilateral.  Light touch is intact bilateral, Muscle strength normal.  MUSCULOSKELETAL: acceptable muscle strength, tone and stability bilateral.  Intrinsic muscluature intact bilateral.  Rectus appearance of foot and digits noted bilateral.   DERMATOLOGIC: skin color, texture, and turgor are within normal limits.  No preulcerative lesions or ulcers  are seen, no interdigital maceration noted.  No open lesions present.  . No drainage noted.  NAILS  Marked incurvation medial border right hallyx with redness and swelling noted.      Assessment:     Ingrowing Toenail right hallux. Paronychia right hallux    Plan:     ROV  Nail surgery.  Treatment options and alternatives discussed.  Recommended permanent phenol matrixectomy and patient agreed.  Right hallux  was prepped with alcohol and a toe block of 3cc of 2% lidocaine plain was administered in a digital toe block. .  The toe was then prepped with betadine solution .  The offending nail border was then excised and matrix tissue exposed.  Phenol was then applied to the matrix tissue followed by an alcohol wash.   Antibiotic ointment and a dry sterile dressing was applied.  The patient was dispensed instructions for aftercare. RTC 1 week prn     Helane GuntherGregory Codi Folkerts DPM

## 2015-10-07 NOTE — ED Provider Notes (Signed)
Medical screening examination/treatment/procedure(s) were performed by non-physician practitioner and as supervising physician I was immediately available for consultation/collaboration.   EKG Interpretation None       Jacalyn LefevreJulie Aundray Cartlidge, MD 10/07/15 [phone]

## 2017-01-13 IMAGING — CR DG ABDOMEN ACUTE W/ 1V CHEST
3 series · 3 of 3 positions shown · non-contrast
Comparison: CT 08/08/2015

CLINICAL DATA: Upper abdominal pain for 2 days. Nausea and
vomiting.

EXAM:
DG ABDOMEN ACUTE W/ 1V CHEST

[chest pa]
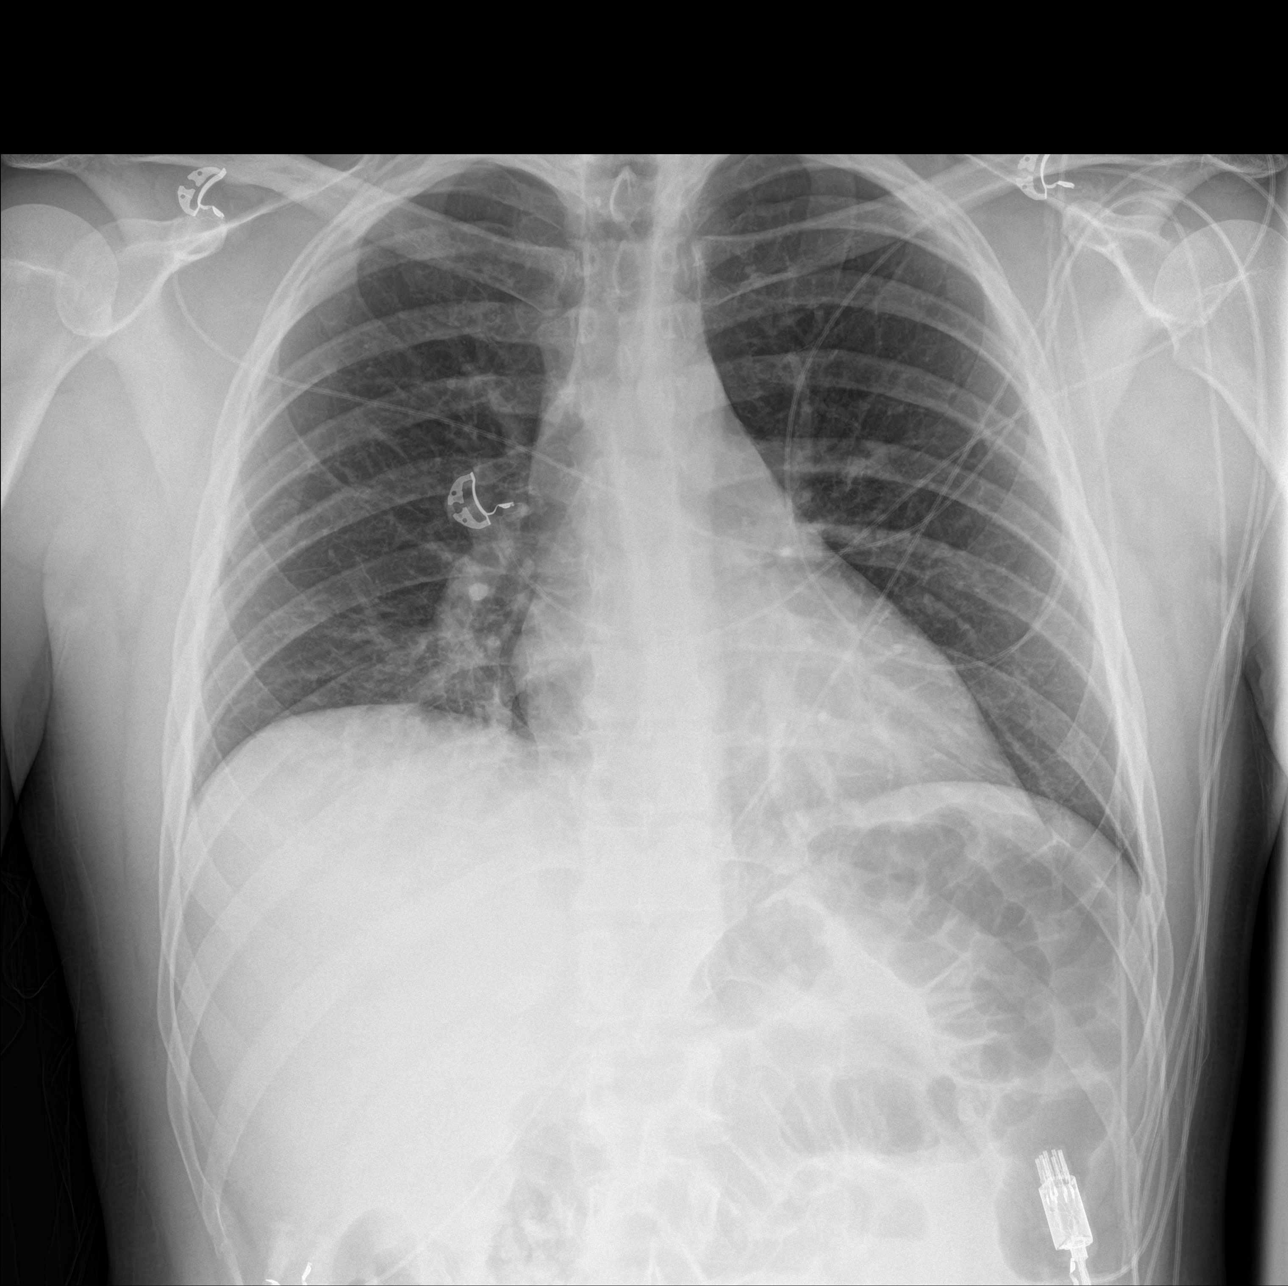

[abdomen erect]
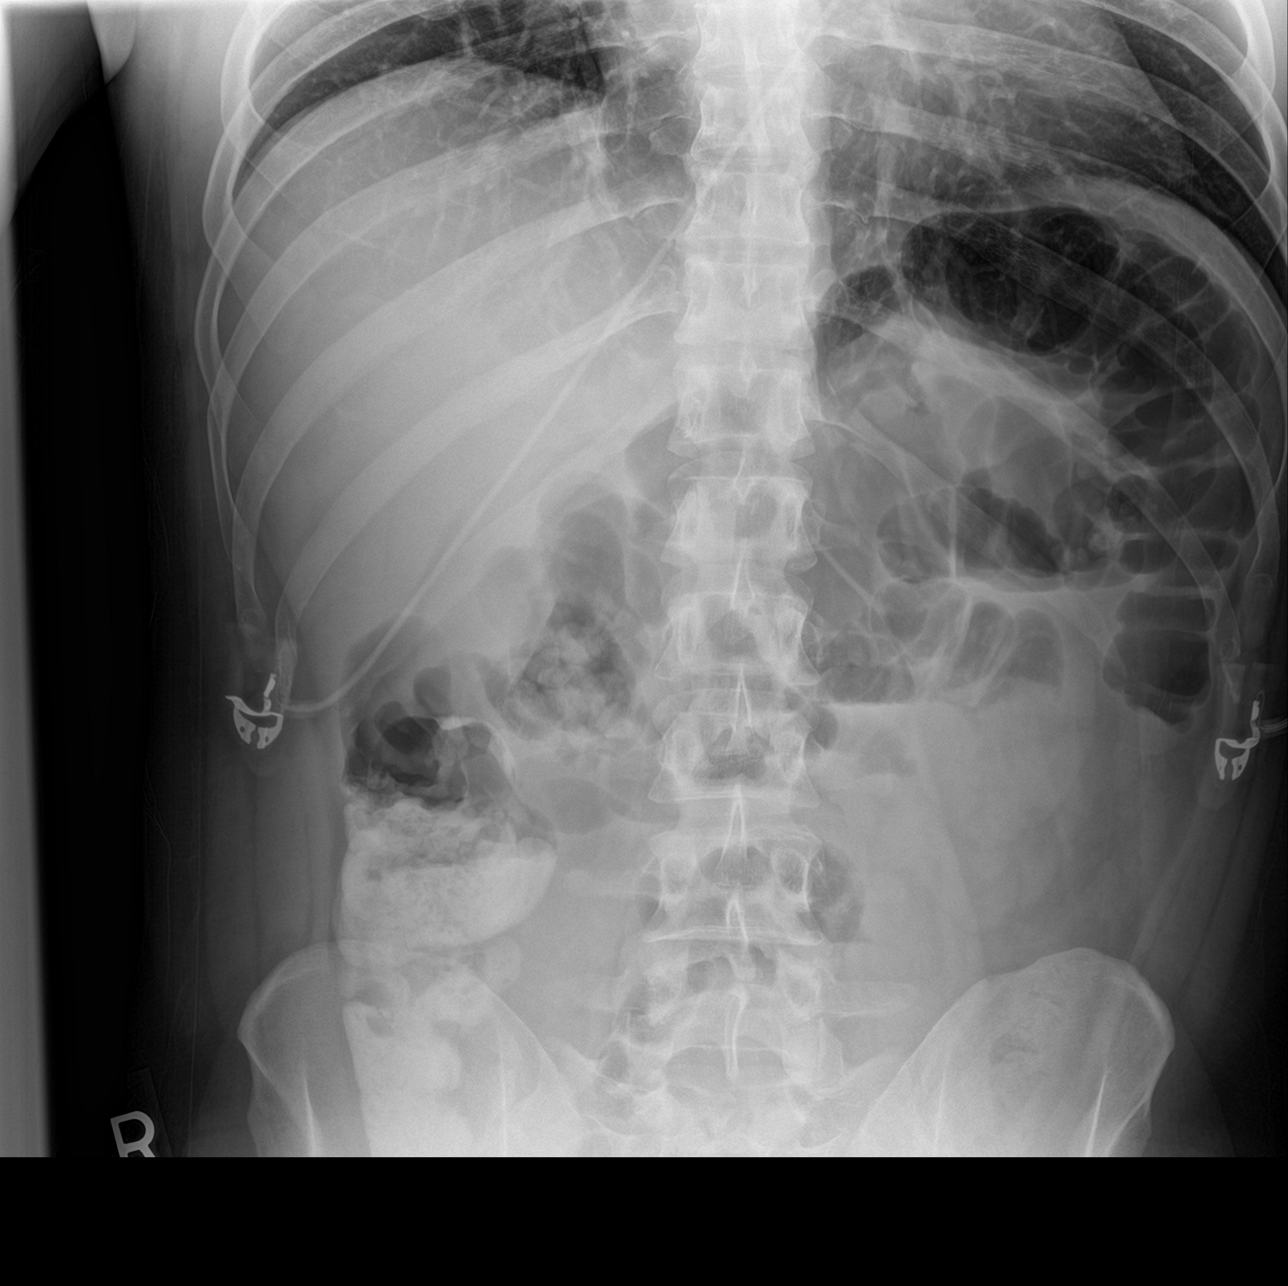

[abdomen supine]
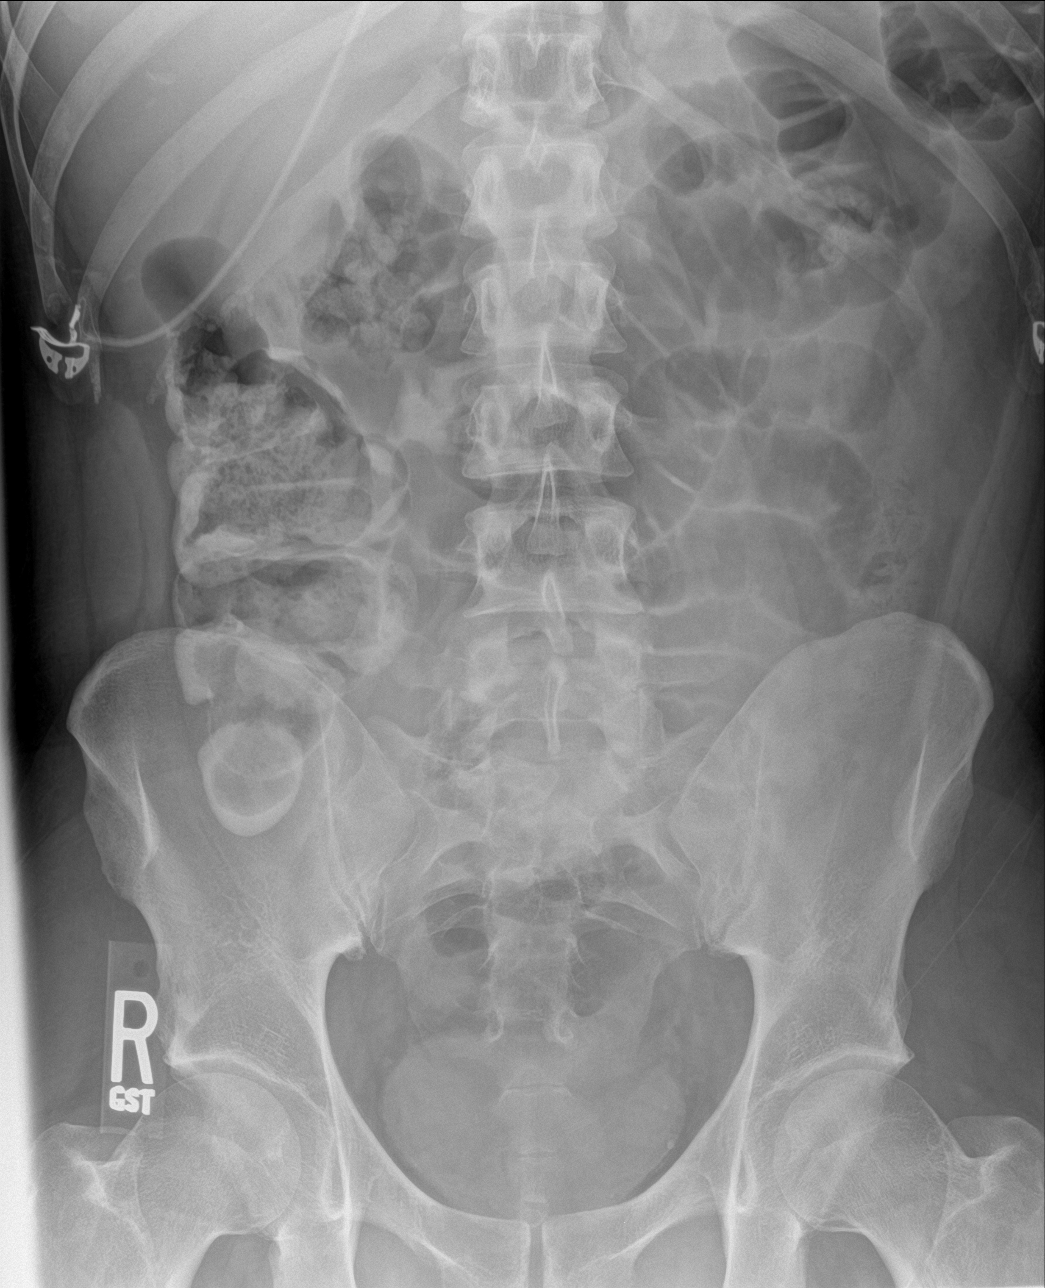

[3 of 3 positions shown; findings below may reference images not displayed]

FINDINGS: The cardiomediastinal contours are normal. The lungs are clear.
There is no free intra-abdominal air. Mild gaseous distention of
small and large bowel loops in the central abdomen without
obstruction. Enteric contrast from prior CT within the colon. Small
volume of stool throughout the colon. Excreted intravenous contrast
in the bladder. No radiopaque calculi. No acute osseous
abnormalities are seen.
IMPRESSION: Mild gaseous distention of small and large bowel loops in the
central abdomen, may be ileus. Patient with laboratory findings
compatible with pancreatitis, ileus with a common finding with
pancreatitis. No obstruction, enteric contrast from CT 2 days prior
within the colon.

## 2017-02-12 IMAGING — US US ABDOMEN COMPLETE
1 series · 14 of 25 positions shown · non-contrast
Comparison: CT abdomen and pelvis 08/08/2015.

CLINICAL DATA: Epigastric pain.  History of pancreatitis.

EXAM:
ABDOMEN ULTRASOUND COMPLETE

[Series 1: us abdomen complete · 0.23mm/px · 14 of 67 slices shown]
[im 1/67]
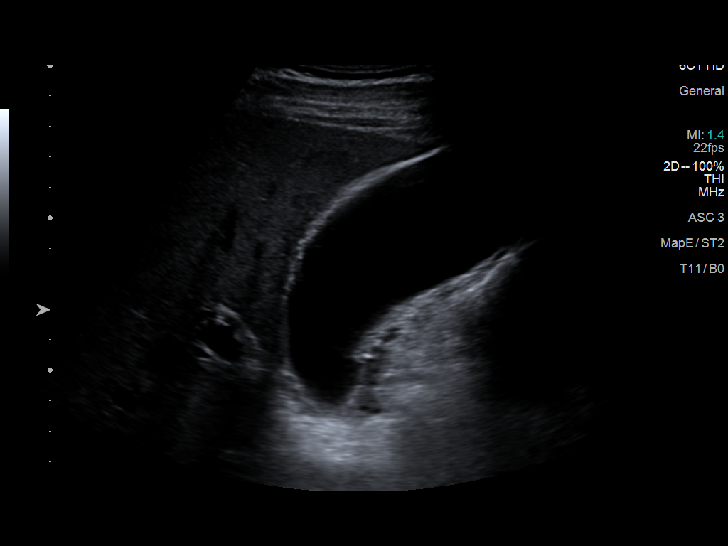
[im 6/67]
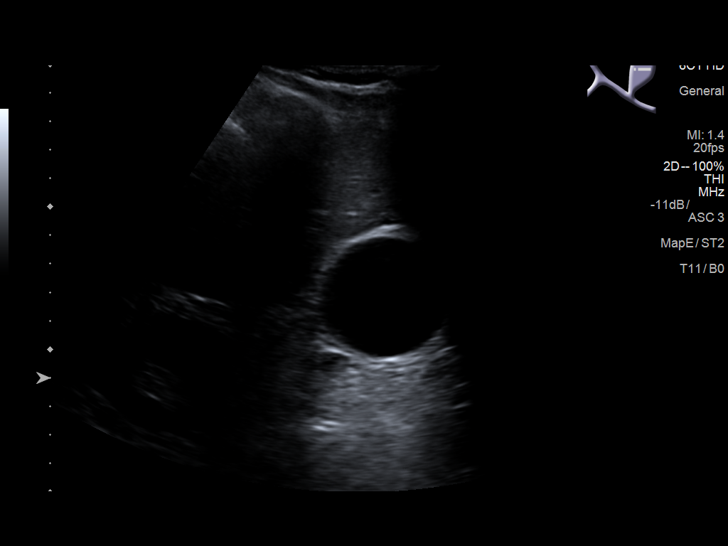
[im 12/67]
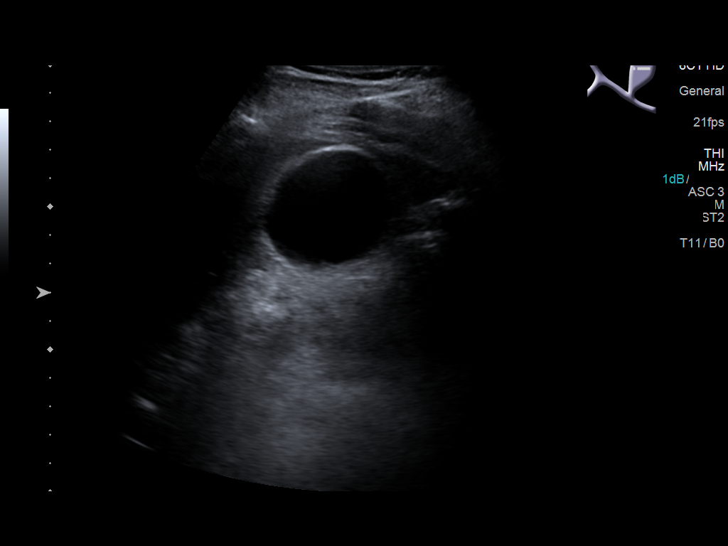
[im 17/67]
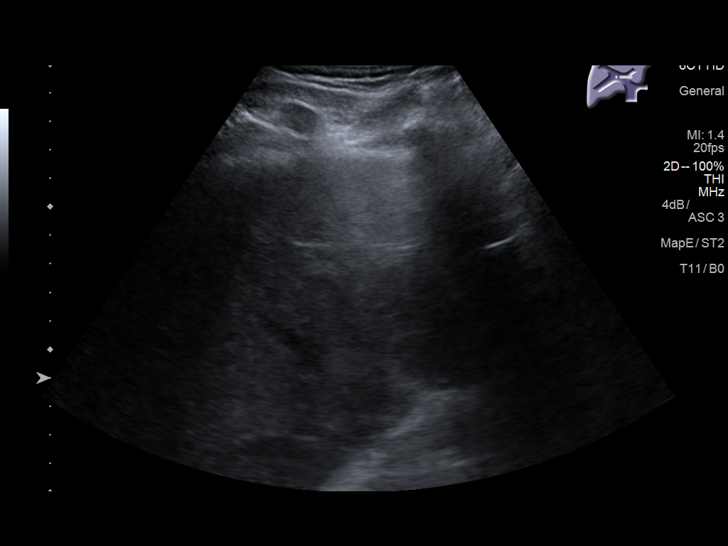
[im 23/67]
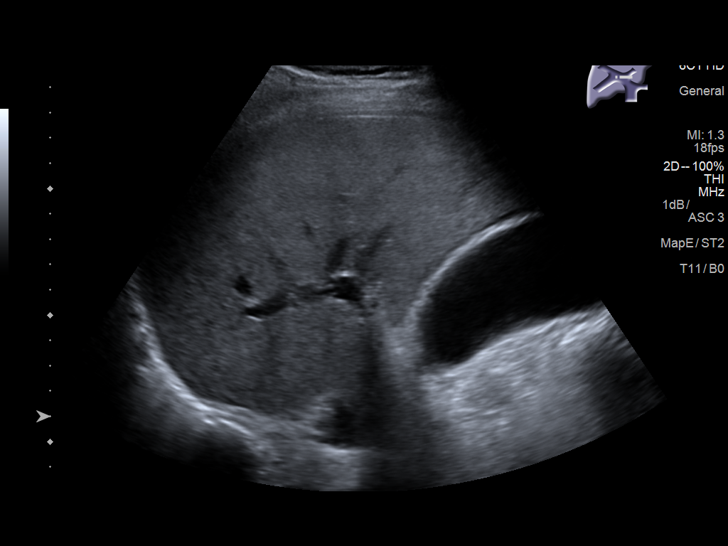
[im 25/67]
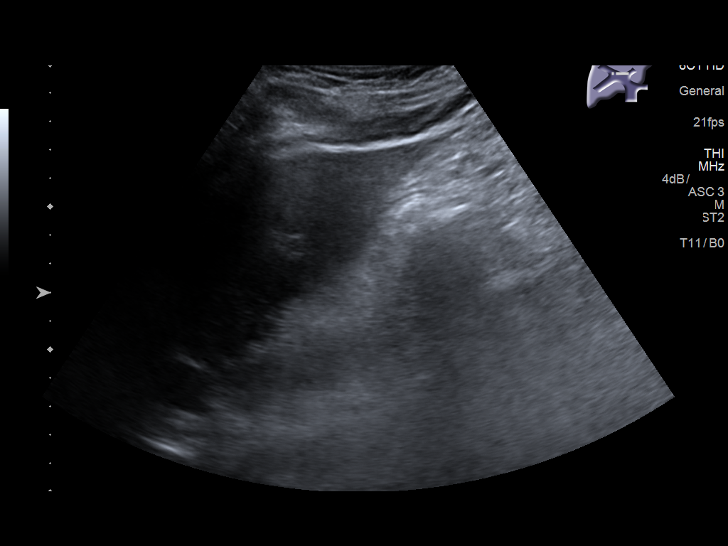
[im 31/67]
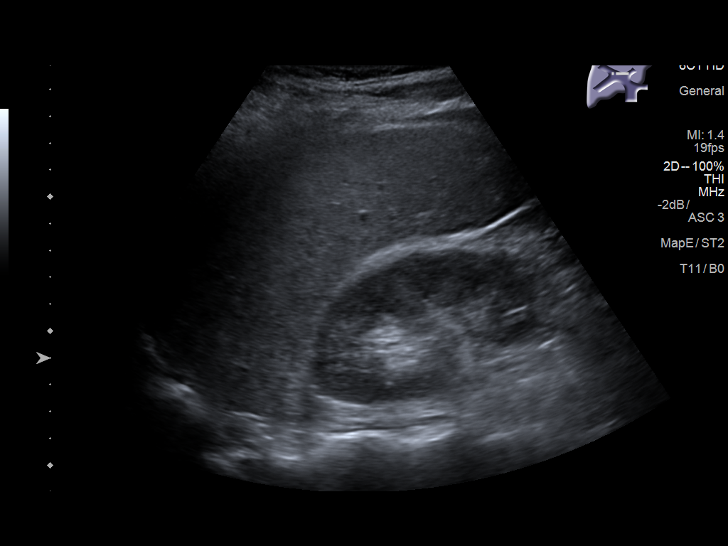
[im 36/67]
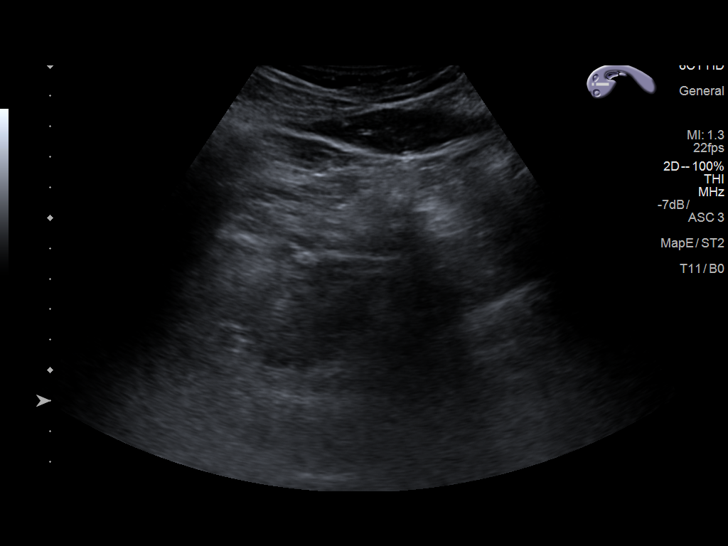
[im 42/67]
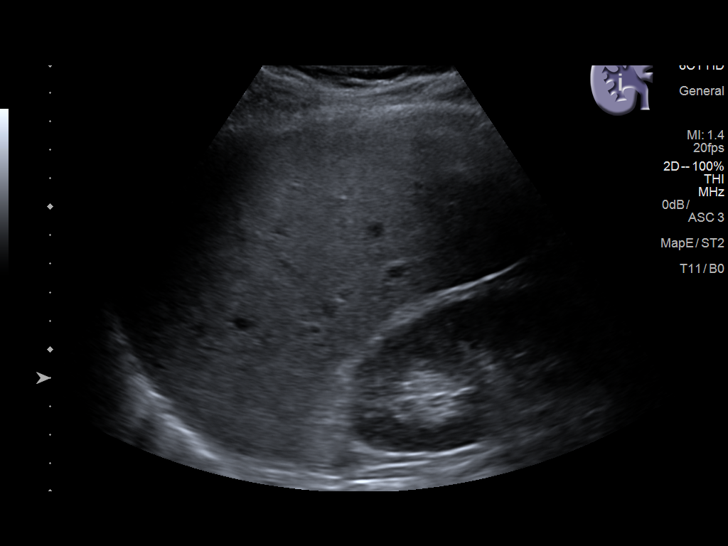
[im 45/67]
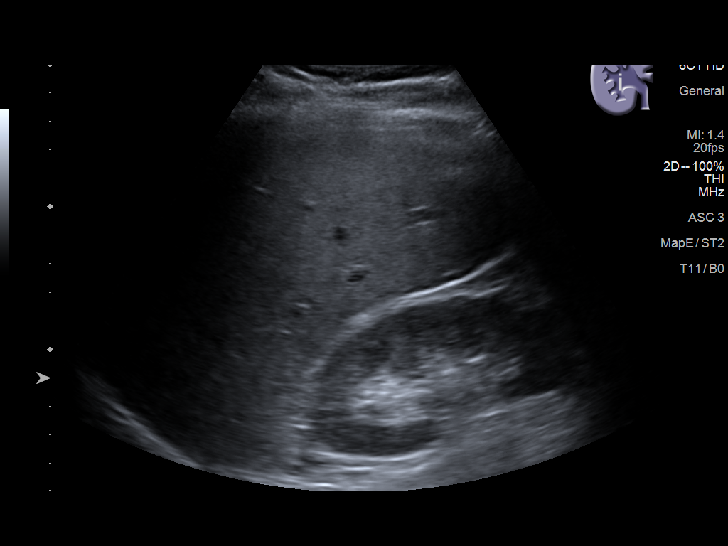
[im 50/67]
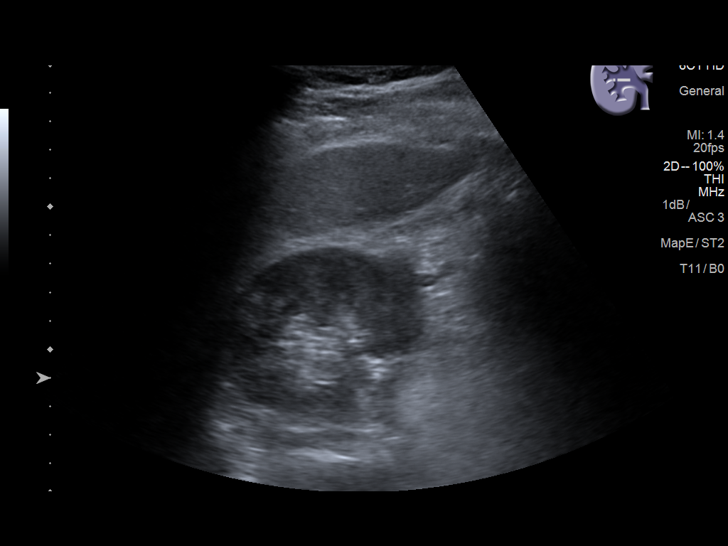
[im 56/67]
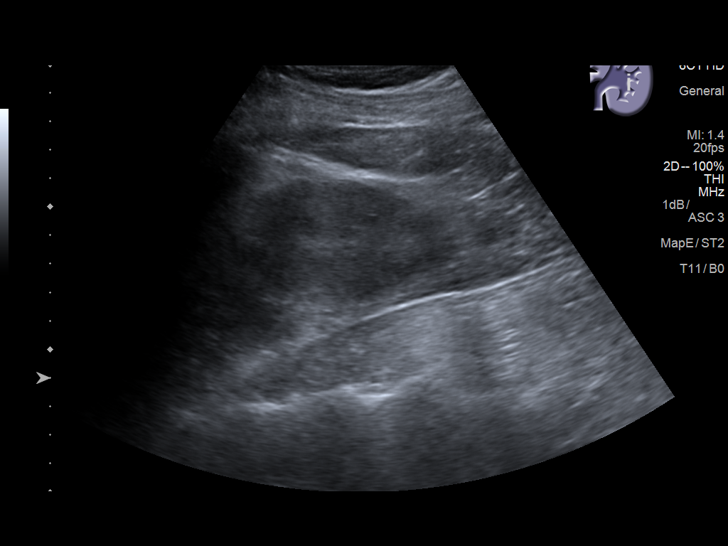
[im 61/67]
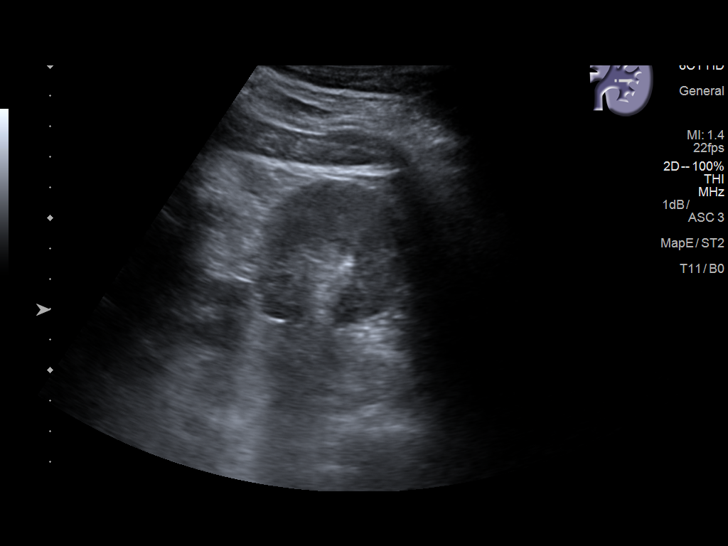
[im 67/67]
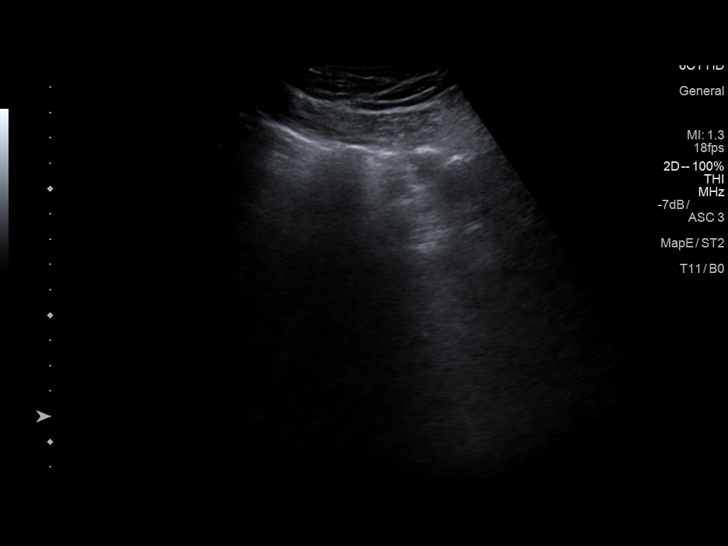

[14 of 25 positions shown; findings below may reference images not displayed]

FINDINGS: Gallbladder: No gallstones or wall thickening visualized. No
sonographic Murphy sign noted by sonographer.

Common bile duct: Diameter: 6.2 mm, upper limits normal.

Liver: No focal lesion identified. Within normal limits in
parenchymal echogenicity.

IVC: No abnormality visualized.

Pancreas: Obscured by gas.

Spleen: Size and appearance within normal limits.

Right Kidney: Length: 12.5 cm. Echogenicity within normal limits. No
mass or hydronephrosis visualized.

Left Kidney: Length: 12.0 cm. Echogenicity within normal limits. No
mass or hydronephrosis visualized.

Abdominal aorta: 2.3 cm.

Other findings: None.
IMPRESSION: Unremarkable abdominal ultrasound.
